# Patient Record
Sex: Female | Born: 1960 | Race: Black or African American | Hispanic: No | Marital: Single
Health system: Southern US, Academic
[De-identification: ages and names within clinical notes are randomized; demographics above are authoritative.]

## PROBLEM LIST (undated history)

## (undated) ENCOUNTER — Encounter

## (undated) ENCOUNTER — Ambulatory Visit: Payer: Medicare (Managed Care) | Attending: Infectious Disease | Primary: Infectious Disease

## (undated) ENCOUNTER — Ambulatory Visit: Payer: Medicare (Managed Care) | Attending: Pharmacist | Primary: Pharmacist

## (undated) ENCOUNTER — Ambulatory Visit: Payer: MEDICARE | Attending: "Endocrinology | Primary: "Endocrinology

## (undated) ENCOUNTER — Ambulatory Visit

## (undated) ENCOUNTER — Inpatient Hospital Stay: Payer: Medicare (Managed Care)

## (undated) ENCOUNTER — Telehealth: Attending: Infectious Disease | Primary: Infectious Disease

## (undated) ENCOUNTER — Ambulatory Visit: Payer: MEDICAID

## (undated) ENCOUNTER — Ambulatory Visit: Payer: MEDICARE

## (undated) ENCOUNTER — Encounter: Attending: Infectious Disease | Primary: Infectious Disease

## (undated) ENCOUNTER — Telehealth

## (undated) ENCOUNTER — Ambulatory Visit
Payer: MEDICARE | Attending: Rehabilitative and Restorative Service Providers" | Primary: Rehabilitative and Restorative Service Providers"

## (undated) ENCOUNTER — Ambulatory Visit: Payer: Medicare (Managed Care)

## (undated) ENCOUNTER — Ambulatory Visit: Attending: Pharmacist | Primary: Pharmacist

## (undated) ENCOUNTER — Encounter: Attending: "Endocrinology | Primary: "Endocrinology

## (undated) ENCOUNTER — Ambulatory Visit: Payer: MEDICARE | Attending: Infectious Disease | Primary: Infectious Disease

## (undated) ENCOUNTER — Encounter
Attending: Student in an Organized Health Care Education/Training Program | Primary: Student in an Organized Health Care Education/Training Program

## (undated) ENCOUNTER — Encounter
Attending: Pharmacist Clinician (PhC)/ Clinical Pharmacy Specialist | Primary: Pharmacist Clinician (PhC)/ Clinical Pharmacy Specialist

## (undated) ENCOUNTER — Telehealth: Attending: Clinical | Primary: Clinical

## (undated) ENCOUNTER — Other Ambulatory Visit

## (undated) ENCOUNTER — Telehealth: Attending: "Endocrinology | Primary: "Endocrinology

## (undated) ENCOUNTER — Ambulatory Visit: Payer: MEDICARE | Attending: Pharmacist | Primary: Pharmacist

## (undated) ENCOUNTER — Encounter: Attending: Medical | Primary: Medical

## (undated) DIAGNOSIS — I10 Essential (primary) hypertension: Secondary | ICD-10-CM

## (undated) DIAGNOSIS — Z21 Asymptomatic human immunodeficiency virus [HIV] infection status: Secondary | ICD-10-CM

## (undated) DIAGNOSIS — B20 Human immunodeficiency virus [HIV] disease: Secondary | ICD-10-CM

## (undated) DIAGNOSIS — E119 Type 2 diabetes mellitus without complications: Secondary | ICD-10-CM

## (undated) HISTORY — PX: TOTAL ABDOMINAL HYSTERECTOMY: SHX209

## (undated) HISTORY — PX: TRIGGER FINGER RELEASE: SHX641

---
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
## 2015-07-20 NOTE — Unmapped (Incomplete)
Diabetes Self Management Assessment    PCP: Conard Novak, MD, Phone: [phone], Fax: [phone]    Who is prescribing your diabetes medications?   Dr. Allena Katz    What was your last A1c result and date? 9.5    Epic available lab results  Lab Results   Component Value Date    A1C 9.5* 03/25/2015    A1C 8.9* 12/10/2014    A1C 11.6* 10/01/2014       What medications do you take for your diabetes? Metformin, glipizide twice a day    Epic has a current medication list which includes the following prescription(s): acyclovir, amitriptyline, blood sugar diagnostic, blood-glucose meter, clonidine hcl, elvitegravir-cobicistat-emtricitabine-tenofovir, glipizide, ibuprofen, lancets, metformin, methadone, omeprazole, and pravastatin.    In the last 7 days have you missed any doses of your diabetes medications? no    Do you have a blood glucose meter? yes  If so how often do you test your blood glucose daily? Four times   What are your most recent results from your blood glucose readings? 103     Do you follow any special diet? Stopped eating white bread, rice and sweets.  Trying to do sugar free cookies.    What do you drink for beverages each day & amount? Mostly drinking water.  Thirsty a lot.  Keep water bottle.    Do you participate in any exercise routine? Walk once in while.  Know that I need to exercise.  Have gained weight. Discussed importance of regular exercise and encouraged her to start out by walking 30 minutes a day.  She states she feels safe in her neighborhood and so suggested just walking 15 minutes in one direction and then turning around and coming back home to start and gradually increase up to an hour.    If so what type and how often?  n/a    Duration of Call: 15 minutes    Patient missed her appointment in Endocrinology in March.  She was aware she missed the appointment and stated that she has had two deaths in her family recently and has been traveling out of state.  I gave her the number to endocrinology and she is going to try to schedule the appointment on the same day that she has her ID appointment as she is picked up very early by the van to come to appointment.

## 2016-09-19 MED FILL — GENVOYA/150-150-200-1/TABS: GENVOYA/150-150-200-1/TABS | 30 days supply | Qty: 30 | Fill #1

## 2016-09-19 MED FILL — PRAVASTATIN SODIUM/20MG/TABS: PRAVASTATIN SODIUM/20MG/TABS | 30 days supply | Qty: 30 | Fill #4

## 2016-09-30 MED ORDER — METHADONE 10 MG TABLET
ORAL_TABLET | Freq: Four times a day (QID) | ORAL | 0 refills | 0 days | Status: CP
Start: 2016-09-30 — End: 2016-10-27

## 2016-10-04 NOTE — Unmapped (Signed)
Outreached to patient to schedule Annual Wellness Visit.  Patient was unable to be contacted. Left Message.

## 2016-10-17 NOTE — Unmapped (Unsigned)
[facility] Pharmacy Refill and Clinical Coordination Note: HIV     HIV Medication(s): Genvoya  Additional Medication(s): pravastatin, aspirin    Anna Cantrell, DOB: 11/22/1960  Phone: [phone] (home) , Alternate phone contact: N/A  Shipping address: [address]  [place] [address]  Phone or address changes today?: No  All above HIPAA information verified.  Insurance changes? No    Completed refill and clinical call assessment today to schedule patient's medication shipment from the [facility] [phone]).      MEDICATION RECONCILIATION    Confirmed the medication and dosage are correct and have not changed: Yes, regimen is correct and unchanged.    Were there any changes to your medication(s) in the past month:  No, there are no changes reported at this time.    HIV-related labs:  Lab Results   Component Value Date/Time    HIVRS Not Detected 03/30/2016 03:13 PM    HIVRS Not Detected 09/23/2015 02:47 PM    HIVRS Not Detected 03/25/2015 01:12 PM    HIVCM  03/30/2016 03:13 PM      Comment:      HIV-1 quantification by real-time RT-PCR is performed using the Abbott RealTime  HIV-1 test. This test is FDA approved and can quantitate HIV-1 RNA over the  range of 40 - 1,000,000 copies/mL (1.60 log(10) -6.00 log(10) copies/mL). The reference range for this assay is Not Detected.    HIVCM  09/23/2015 02:47 PM      Comment:      HIV-1 quantification by real-time RT-PCR is performed using the Abbott RealTime  HIV-1 test. This test is FDA approved and can quantitate HIV-1 RNA over the  range of 40 - 1,000,000 copies/mL (1.60 log(10) -6.00 log(10) copies/mL). The reference range for this assay is Not Detected.    HIVCM  03/25/2015 01:12 PM      Comment:      HIV-1 quantification by real-time RT-PCR is performed using the Abbott RealTime  HIV-1 test. This test is FDA approved and can quantitate HIV-1 RNA over the  range of 40 - 1,000,000 copies/mL (1.60 log(10) -6.00 log(10) copies/mL). The reference range for this assay is Not Detected.    ACD4 1,245 03/30/2016 03:13 PM    ACD4 1,873 03/25/2015 01:12 PM    ACD4 1,106 09/03/2014 10:26 AM         ADHERENCE      Number of tablets of HIV Medication(s) at home: 7    Did you miss any doses in the past 4 weeks? No missed doses reported.  Adherence counseling provided? Not needed     SIDE EFFECT MANAGEMENT    Are you tolerating your medication?:  Anna Cantrell reports tolerating the medication.  Side effect management discussed: None      Therapy is appropriate and should be continued.          FINANCIAL/SHIPPING    Delivery Scheduled: Yes, Expected medication delivery date: 10/19/16   Additional medications refilled: No additional medications/refills needed at this time.    Anna Cantrell did not have any additional questions at this time.    Delivery address validated in FSI scheduling system: Yes, address listed above is correct.      We will follow up with patient monthly for standard refill processing and delivery.      Thank you,  [name]   [facility] Shared [facility] Specialty Pharmacist

## 2016-10-18 MED FILL — PRAVASTATIN SODIUM/20MG/TABS: PRAVASTATIN SODIUM/20MG/TABS | 30 days supply | Qty: 30 | Fill #5

## 2016-10-18 MED FILL — GENVOYA/150-150-200-1/TABS: GENVOYA/150-150-200-1/TABS | 30 days supply | Qty: 30 | Fill #2

## 2016-10-22 MED ORDER — ASPIRIN 81 MG TABLET,DELAYED RELEASE
ORAL_TABLET | Freq: Every day | ORAL | 3 refills | 0.00000 days | Status: CP
Start: 2016-10-22 — End: 2016-10-22

## 2016-10-22 MED ORDER — ASPIRIN 81 MG TABLET,DELAYED RELEASE: 81 mg | tablet | Freq: Every day | 3 refills | 0 days | Status: AC

## 2016-10-27 MED ORDER — METHADONE 10 MG TABLET
ORAL_TABLET | Freq: Four times a day (QID) | ORAL | 0 refills | 0 days | Status: CP
Start: 2016-10-27 — End: 2016-10-28

## 2016-10-28 MED ORDER — METHADONE 10 MG TABLET
ORAL_TABLET | Freq: Four times a day (QID) | ORAL | 0 refills | 0.00000 days | Status: CP
Start: 2016-10-28 — End: 2016-11-23

## 2016-10-28 NOTE — Unmapped (Signed)
Patient AWV scheduled for 09/13  with Anna Leiter, LCSW

## 2016-11-09 NOTE — Unmapped (Deleted)
Name: Anna Cantrell  DOB: April 22, 1960  Today's Date: 11/09/2016  Age: 56 y.o.    MEDICARE SCREENING & PREVENTION GUIDELINES RECOMMENDATIONS DATE UP-TO-DATE OR   TO BE REASSESSED   AAA Ultrasound Once in men age 60 to 24 who have ever smoked or currently smoke. Abdominal ultrasound date: Not Found not applicable    Colorectal Cancer Screening Patients 50 to 75: stool cards annually OR colonoscopy every 10 years (or more frequently if high risk) OR FIT-DNA every 3 years.  Colonoscopy date: 05/17/2010  FOBT date: Not Found up to date    DEXA Bone Density Measurement Patients age 15-85 to have a DEXA every 5 years in postmenopausal women, males will defer to PCP. DXA date: Not Found not applicable    Diabetes Eye Exam  Annually if Diabetic, or every 2 years if prior exam did not show retinopathy in both eyes. Most Recent Eye Exam Date: Not Found due    Diabetes Foot Exam  Annually if Diabetic. Most Recent Foot Exam Date: Not Found due    Diabetes Screening (fasting glucose) Annually for patients age 32-70. If risk factors (family hx of DM, hx of gestational DM, and/or PCOS), or diagnosed with pre-diabetes, twice per year. Normal fasting glucose range: 65-99. If diagnosed with diabetes, not applicable. Lab Results   Component Value Date    GLU 301 (H) 03/30/2016     No results found for: GLUF  No results found for: FBSFP  Lab Results   Component Value Date    POCGLU 279 (H) 09/04/2015    not applicable    Heart Disease Screening (fasting lipid panel) Minimum of every 5 years, patients age 43-75,  if no apparent signs or symptoms of heart disease. If diagnosed with hyperlipidemia, not applicable. Lab Results   Component Value Date/Time    CHOL 256 03/18/2013    LDL 64.5 09/23/2015 02:47 PM    LDL 67 03/18/2013    not applicable    Mammogram Screening (women) Age 63-74 every 2 years.  Mammogram date: 02/05/2015 ask patient during AWV   Pelvic Exam & Pap Smear  (women) Women ages 72 to 66 every 3 years with negative cytology (pap smear)  OR,   women ages 77 to 52 every 5 years if they have had both a negative pap and human papillomavirus (HPV) OR,  Every 3 years if they had a positive HPV result. Pap Smear date: Not Found    HPV date: Not Found   ask patient during AWV   Hepatitis C Screening  A one-time screening for HCV infection for adults born between 38 & 1965. Lab Results   Component Value Date/Time    HEPCAB Nonreactive 09/03/2014 10:26 AM    up to date    Tdap/Td Tdap should be a one-time dose for adults and Td is given every 10 years.  See Below   up to date    Influenza Vaccine Annually  See Below   up to date    Prevnar 29 Prevnar given at age 11 and Pneumovax given one year later. These vaccines may be given in a different sequence depending on chronic conditions. (utilize BPA for dosing & administration) See Below   up to date    Pneumovax 23 Prevnar given at age 106 and Pneumovax given one year later. These vaccines may be given in a different sequence depending on chronic conditions. (utilize BPA for dosing & administration) See Below   up to date    Zostavax  Vaccine Once at age 36 or older (may not be covered by Medicare) See Below   not applicable    Shingrix Vaccine 2-dose series, beginning at age 60. The 2nd dose is administered 2-6 months after the first.  See Below   ask patient during AWV     Immunization History   Administered Date(s) Administered   ??? INFLUENZA TIV (TRI) PF (IM) 12/17/2012, 12/05/2013   ??? Influenza Vaccine Quad (IIV4 PF) 44mo+ injectable 12/10/2014, 04/04/2016   ??? Influenza Virus Vaccine, unspecified formulation 11/21/2011   ??? Pneumococcal Conjugate 13-Valent 12/10/2014   ??? Pneumococcal Polysaccharide 23 09/23/2015   ??? TdaP 12/05/2013

## 2016-11-14 NOTE — Unmapped (Signed)
[facility] Special[facility] Pharmacy Refill Coordination Note  Medication: GENVOYA    Unable to reach patient to schedule shipment for medication being filled at [facility]. Left voicemail on phone.  As this is the 3rd unsuccessful attempt to reach the patient, no additional phone call attempts will be made at this time.      Phone numbers attempted: [phone]  [phone]  Last scheduled delivery: SHIPPED 10/18/16    Please call the [facility] at [phone] (option 4) should you have any further questions.      Thanks,  [facility] Shared [facility] Specialty Team

## 2016-11-14 NOTE — Unmapped (Signed)
Pt called agitated stating that she cannot get her meds because of an outstanding co-pay of ~$22 with [facility] Shared services.     I explained to pt that she can ask them to set up a payment plan through [facility] shared services to help pay for the copays. Pt said she filled out an application in clinic which I suppose is the RW application. She thought would cover the expenses of her pharmacy which I told her was not the case.    Explained to her that it allows for pts to have services/access to social work who could assist her. I asked her if she wanted to be transferred to a SW and she said she did not want to speak with a SW at this time.    Sherilynn Phen

## 2016-11-26 MED ORDER — METHADONE 10 MG TABLET
ORAL_TABLET | Freq: Four times a day (QID) | ORAL | 0 refills | 0.00000 days | Status: CP
Start: 2016-11-26 — End: 2016-12-07

## 2016-12-05 MED FILL — GENVOYA/150-150-200-1/TABS: GENVOYA/150-150-200-1/TABS | 30 days supply | Qty: 30 | Fill #3

## 2016-12-05 MED FILL — AMITRIPTYLINE/100MG/TAB: AMITRIPTYLINE/100MG/TAB | 90 days supply | Qty: 90 | Fill #2

## 2016-12-05 MED FILL — PRAVASTATIN SODIUM/20MG/TABS: PRAVASTATIN SODIUM/20MG/TABS | 30 days supply | Qty: 30 | Fill #6

## 2016-12-05 NOTE — Unmapped (Signed)
[facility] Special[facility] Pharmacy Refill Coordination Note  Specialty Medication(s): Genvoya  Additional Medications shipped: amitriptyline, pravastatin    Anna Cantrell, DOB: [birth year]  Phone: [phone] (home) , Alternate phone contact: N/A  Phone or address changes today?: No  All above HIPAA information was verified with patient.  Shipping Address: [address]  [place] [address]   Insurance changes? No    Completed refill call assessment today to schedule patient's medication shipment from the [facility] [phone]).      Confirmed the medication and dosage are correct and have not changed: Yes, regimen is correct and unchanged.    Confirmed patient started or stopped the following medications in the past month:  No, there are no changes reported at this time.    Are you tolerating your medication?:  Anna Cantrell reports tolerating the medication.    ADHERENCE        Did you miss any doses in the past 4 weeks? No missed doses reported.    FINANCIAL/SHIPPING    Delivery Scheduled: Yes, Expected medication delivery date: 12/07/16     Anna Cantrell did not have any additional questions at this time.    Delivery address validated in FSI scheduling system: Yes, address listed in FSI is correct.    We will follow up with patient monthly for standard refill processing and delivery.      Thank you,  Rollen Sox   [facility] Shared [facility] Specialty Pharmacist

## 2016-12-07 ENCOUNTER — Ambulatory Visit
Admission: RE | Admit: 2016-12-07 | Discharge: 2016-12-07 | Disposition: A | Discharge status: Home / Self Care | Payer: MEDICARE | Attending: Infectious Disease | Admitting: Infectious Disease

## 2016-12-07 DIAGNOSIS — Z794 Long term (current) use of insulin: Secondary | ICD-10-CM

## 2016-12-07 DIAGNOSIS — G629 Polyneuropathy, unspecified: Secondary | ICD-10-CM

## 2016-12-07 DIAGNOSIS — E1122 Type 2 diabetes mellitus with diabetic chronic kidney disease: Secondary | ICD-10-CM

## 2016-12-07 DIAGNOSIS — N183 Chronic kidney disease, stage 3 (moderate): Secondary | ICD-10-CM

## 2016-12-07 DIAGNOSIS — Z23 Encounter for immunization: Principal | ICD-10-CM

## 2016-12-07 DIAGNOSIS — E1165 Type 2 diabetes mellitus with hyperglycemia: Secondary | ICD-10-CM

## 2016-12-07 DIAGNOSIS — K219 Gastro-esophageal reflux disease without esophagitis: Secondary | ICD-10-CM

## 2016-12-07 DIAGNOSIS — I10 Essential (primary) hypertension: Secondary | ICD-10-CM

## 2016-12-07 DIAGNOSIS — G894 Chronic pain syndrome: Secondary | ICD-10-CM

## 2016-12-07 DIAGNOSIS — B20 Human immunodeficiency virus [HIV] disease: Secondary | ICD-10-CM

## 2016-12-07 LAB — BASIC METABOLIC PANEL
ANION GAP: 12 mmol/L (ref 9–15)
BLOOD UREA NITROGEN: 11 mg/dL (ref 7–21)
BUN / CREAT RATIO: 12
CALCIUM: 9.5 mg/dL (ref 8.5–10.2)
CHLORIDE: 105 mmol/L (ref 98–107)
CO2: 21 mmol/L — ABNORMAL LOW (ref 22.0–30.0)
CREATININE: 0.9 mg/dL (ref 0.60–1.00)
EGFR MDRD NON AF AMER: >=60 mL/min/{1.73_m2} (ref >=60)
GLUCOSE RANDOM: 287 mg/dL — ABNORMAL HIGH (ref 65–179)
POTASSIUM: 4.1 mmol/L (ref 3.5–5.0)

## 2016-12-07 LAB — CBC W/ AUTO DIFF
EOSINOPHILS ABSOLUTE COUNT: 0.3 10*9/L (ref 0.0–0.4)
HEMATOCRIT: 38.7 % (ref 36.0–46.0)
LARGE UNSTAINED CELLS: 3 % (ref 0–4)
LYMPHOCYTES ABSOLUTE COUNT: 3.6 10*9/L (ref 1.5–5.0)
MEAN CORPUSCULAR HEMOGLOBIN CONC: 30.1 g/dL — ABNORMAL LOW (ref 31.0–37.0)
MEAN CORPUSCULAR HEMOGLOBIN: 28.1 pg (ref 26.0–34.0)
MEAN CORPUSCULAR VOLUME: 93.3 fL (ref 80.0–100.0)
MONOCYTES ABSOLUTE COUNT: 0.2 10*9/L (ref 0.2–0.8)
NEUTROPHILS ABSOLUTE COUNT: 5.2 10*9/L (ref 2.0–7.5)
PLATELET COUNT: 295 10*9/L (ref 150–440)
RED CELL DISTRIBUTION WIDTH: 13.7 % (ref 12.0–15.0)
WBC ADJUSTED: 9.6 10*9/L (ref 4.5–11.0)

## 2016-12-07 LAB — LDL CHOLESTEROL DIRECT: Cholesterol.in LDL:MCnc:Pt:Ser/Plas:Qn:Direct assay: 67.3

## 2016-12-07 LAB — CO2: Carbon dioxide:SCnc:Pt:Ser/Plas:Qn:: 21 — ABNORMAL LOW

## 2016-12-07 LAB — WBC ADJUSTED: Lab: 9.6

## 2016-12-07 LAB — SLIDE REVIEW

## 2016-12-07 LAB — TOXIC GRANULATION

## 2016-12-07 MED ORDER — CLONIDINE HCL 0.2 MG TABLET: tablet | 99 refills | 0 days

## 2016-12-07 MED ORDER — CLONIDINE HCL 0.2 MG TABLET
ORAL_TABLET | Freq: Two times a day (BID) | ORAL | PRN refills | 0.00000 days | Status: CP
Start: 2016-12-07 — End: 2017-06-07

## 2016-12-07 MED ORDER — ELVITEG 150 MG-COB 150 MG-EMTRICIT 200 MG-TENOFO ALAFENAM 10 MG TABLET
ORAL_TABLET | Freq: Every day | ORAL | 8 refills | 0.00000 days | Status: CP
Start: 2016-12-07 — End: 2016-12-07

## 2016-12-07 MED ORDER — ELVITEG 150 MG-COB 150 MG-EMTRICIT 200 MG-TENOFO ALAFENAM 10 MG TABLET: 1 | tablet | Freq: Every day | 8 refills | 0 days | Status: AC

## 2016-12-07 MED ORDER — OMEPRAZOLE 40 MG CAPSULE,DELAYED RELEASE: 40 mg | capsule | 99 refills | 0 days

## 2016-12-07 MED ORDER — METHADONE 10 MG TABLET
ORAL_TABLET | Freq: Four times a day (QID) | ORAL | 0 refills | 0 days | Status: CP
Start: 2016-12-07 — End: 2016-12-22

## 2016-12-07 MED ORDER — PRAVASTATIN 20 MG TABLET
ORAL_TABLET | Freq: Every day | ORAL | 8 refills | 0.00000 days | Status: CP
Start: 2016-12-07 — End: 2016-12-07

## 2016-12-07 MED ORDER — AMITRIPTYLINE 100 MG TABLET
ORAL_TABLET | Freq: Every evening | ORAL | PRN refills | 0.00000 days | Status: CP
Start: 2016-12-07 — End: 2017-06-07

## 2016-12-07 MED ORDER — ASPIRIN 81 MG TABLET,DELAYED RELEASE
ORAL_TABLET | Freq: Every day | ORAL | 3 refills | 0.00000 days | Status: CP
Start: 2016-12-07 — End: 2017-06-07

## 2016-12-07 MED ORDER — AMITRIPTYLINE 100 MG TABLET: 100 mg | tablet | Freq: Every evening | 99 refills | 0 days

## 2016-12-07 MED ORDER — INSULIN GLARGINE (U-100) 100 UNIT/ML (3 ML) SUBCUTANEOUS PEN
Freq: Every evening | SUBCUTANEOUS | 0 refills | 0 days | Status: CP
Start: 2016-12-07 — End: 2016-12-21

## 2016-12-07 MED ORDER — METFORMIN 1,000 MG TABLET: 1000 mg | tablet | Freq: Two times a day (BID) | 3 refills | 0 days | Status: AC

## 2016-12-07 MED ORDER — PRAVASTATIN 20 MG TABLET: 20 mg | tablet | Freq: Every day | 8 refills | 0 days | Status: AC

## 2016-12-07 MED ORDER — METFORMIN 1,000 MG TABLET
ORAL_TABLET | Freq: Two times a day (BID) | ORAL | 3 refills | 0.00000 days | Status: CP
Start: 2016-12-07 — End: 2017-06-07

## 2016-12-07 MED ORDER — OMEPRAZOLE 40 MG CAPSULE,DELAYED RELEASE
ORAL_CAPSULE | Freq: Every day | ORAL | 99 refills | 0.00000 days | Status: CP
Start: 2016-12-07 — End: 2016-12-07

## 2016-12-07 NOTE — Unmapped (Signed)
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 Her son was also found to be HIV+ and died at age 35 of AIDS, ca. September 05, 2003. Husband x 17 yrs, has always been HIV-negative practicing safe sex. Long term ART, few details, but never failed therapy, HIV RNA suppressed x years. Has been on qd Epizcom and Sustiva for years, despite some chronic HA she attributes to these meds.Was on suppressive Diflucan.    Doing generally well, feels she has better control of DM, reduced smoking.    Depression: going to counselor at [facility] in [place], going well  DM: Treated with oral agents. Metformin, Glipizide bid. Now at [facility] clinic, added qhs insulin 20U   Hypercholesterolemia: Pravastatin qd  HTN: On Clonidine 0.2 bid  GERD: Omeprazole 40 > prn  Chronic pain syndrome and Neuropathy: Dx and duration unclear, but likely 2ndary to diabetes. Has been getting Methadone 10 mg qid x several years and now stable/managable. Carries Dx also distinct peripheral neuropathy as well, cause unclear. Amtriptyline 100 qhs but decreasing use.  Bilateral hearing loss: ringing, cause, hx unclear  HSV II: Perirectal, prn ACV, none lately.  Smoking: few cigarettes every few days, says a pack lasts several weeks.  Hysterectomy 1995    ALLERGIES: vicodoin. tramadol    MEDS:  acyclovir (ZOVIRAX) 400 MG Three (3) times a day as needed. Not used recently  amitriptyline (ELAVIL) 100 MG nightly.   Genvoya qd  cloNIDine HCl (CATAPRES) 0.2 MG bid   glipiZIDE (GLUCOTROL) 10 MG bid  metFORMIN (GLUCOPHAGE) 1000 MG bid  methadone (DOLOPHINE) 10 MG tablet qid  Omeprazole (PRILOSEC) 40 MG qd prn, taking less and less  pravastatin (PRAVACHOL) 20 MG qd  Long acting Insulin on sliding scale 20U at night    PHYSICAL EXAMINATION:   AVSS as above   GENERAL: NAD, pleasant  HEENT: [place]/AT, PERRLA, EOMI, Orophar cl, dentition good, no oral lesions , fundi normal  Chest: Lungs clear throughout   Cor RRR S1S2   Breasts no masses  Abd soft nontender, no HSM NABS   Gen/Rect nl ext exam  Ext No CCE no lesion, nl jts   Neuro no abnormailities, CrNN 2-12 intact, Nl strength, sensation distally     A/P:  HIV:  Doing well, labs last time showed HIV RNA < 50 and CD4 1100 >> recheck HIV RNA & CD$    Switch to Genvoya seems to be doing well  DM:  Insulin as per Endo >> provider in [place]  Chronic pain: Will refill methadone monthly  HTN  No change, stable  Lipids:   LDL cholesterol 74 last  HSV  Stable  GERD  Stable using little PPI  HCM:  Mammogram OK Dec 2016, will repeat now    Refer for pap (none x yrs post hysterectomy) HER REQUEST, but will do here if not done by next visit    Colonoscopy in September 05, 2011    Wt loss, exercise >> lost 3 lbs    To see Optho    Try to get [facility] coverage for dental implants needed  RTC 6 months

## 2016-12-08 LAB — LYMPH MARKER LIMITED,FLOW
ABSOLUTE CD8 CNT: 1322 {cells}/uL (ref 180–1520)
CD3% (T CELLS)": 76 % (ref 61–86)
CD4% (T HELPER)": 41 % (ref 34–58)
CD4:CD8 RATIO: 1.2 (ref 0.9–4.8)

## 2016-12-08 LAB — ESTIMATED AVERAGE GLUCOSE: Estimated average glucose:MCnc:Pt:Bld:Qn:Estimated from glycated hemoglobin: 344

## 2016-12-08 LAB — CD3% (T CELLS)": Lab: 76

## 2016-12-08 LAB — SYPHILIS RPR SCREEN: Reagin Ab:PrThr:Pt:Ser:Ord:RPR: NONREACTIVE

## 2016-12-09 LAB — HIV RNA, QUANTITATIVE, PCR: HIV RNA QNT RSLT: NOT DETECTED

## 2016-12-09 LAB — HIV RNA: HIV 1 RNA:NCnc:Pt:Ser/Plas:Qn:Probe.amp.tar: 0

## 2016-12-21 MED ORDER — INSULIN GLARGINE (U-100) 100 UNIT/ML (3 ML) SUBCUTANEOUS PEN
Freq: Every evening | SUBCUTANEOUS | 0 refills | 0.00000 days | Status: CP
Start: 2016-12-21 — End: 2016-12-21

## 2016-12-21 MED ORDER — INSULIN GLARGINE (U-100) 100 UNIT/ML (3 ML) SUBCUTANEOUS PEN: mL | 0 refills | 0 days

## 2016-12-25 MED ORDER — METHADONE 10 MG TABLET
ORAL_TABLET | Freq: Four times a day (QID) | ORAL | 0 refills | 0 days | Status: CP
Start: 2016-12-25 — End: 2017-01-18

## 2016-12-28 MED FILL — OMEPRAZOLE/40MG/CPDR: OMEPRAZOLE/40MG/CPDR | 30 days supply | Qty: 30 | Fill #0

## 2016-12-28 MED FILL — LANTUS SOLOSTAR (BOX)/100UNIT/ML/SOLN: LANTUS SOLOSTAR (BOX)/100UNIT/ML/SOLN | 28 days supply | Qty: 1 | Fill #0

## 2016-12-28 MED FILL — PRAVASTATIN SODIUM/20MG/TABS: PRAVASTATIN SODIUM/20MG/TABS | 30 days supply | Qty: 30 | Fill #7

## 2016-12-28 MED FILL — METFORMIN HCL/1000MG/TABS: METFORMIN HCL/1000MG/TABS | 90 days supply | Qty: 180 | Fill #0

## 2017-01-04 MED FILL — GENVOYA/150-150-200-1/TABS: GENVOYA/150-150-200-1/TABS | 30 days supply | Qty: 30 | Fill #4

## 2017-01-04 NOTE — Unmapped (Signed)
Addended by: Adolphus Birchwood on: 01/04/2017 03:54 PM     Modules accepted: Orders

## 2017-01-04 NOTE — Unmapped (Addendum)
Addendum: [facility] was able to ship out med 10/31, so will be delivered 11/1. Anna Cantrell    [facility] Pharmacy Refill Coordination Note  Specialty Medication(s): GENVOYA  Others: Glipizide      Anna Cantrell, DOB: 05-17-1960  Phone: [phone] (home) , Alternate phone contact: N/A  Phone or address changes today?: No  All above HIPAA information was verified with patient.  Shipping Address: [address]  [place] [address]   Insurance changes? No    Completed refill call assessment today to schedule patient's medication shipment from the [facility] [phone]).      Confirmed the medication and dosage are correct and have not changed: Yes, regimen is correct and unchanged.    Confirmed patient started or stopped the following medications in the past month:  No, there are no changes reported at this time.    Are you tolerating your medication?:  Anna Cantrell reports tolerating the medication.    ADHERENCE        Did you miss any doses in the past 4 weeks? No missed doses reported.    FINANCIAL/SHIPPING    Delivery Scheduled: Yes, Expected medication delivery date: 01/06/17     Anna Cantrell did not have any additional questions at this time.    Delivery address validated in FSI scheduling system: Yes, address listed in FSI is correct.    We will follow up with patient monthly for standard refill processing and delivery.      Thank you,  [name]   [facility] Shared [facility] Specialty Technician

## 2017-01-05 MED ORDER — GLIPIZIDE 10 MG TABLET: 10 mg | tablet | Freq: Two times a day (BID) | 11 refills | 0 days | Status: AC

## 2017-01-05 MED ORDER — GLIPIZIDE 10 MG TABLET
ORAL_TABLET | Freq: Two times a day (BID) | ORAL | 11 refills | 0.00000 days | Status: CP
Start: 2017-01-05 — End: 2017-06-07

## 2017-01-06 MED FILL — GLIPIZIDE/10MG/TAB: GLIPIZIDE/10MG/TAB | 90 days supply | Qty: 180 | Fill #0

## 2017-01-20 MED ORDER — METHADONE 10 MG TABLET
ORAL_TABLET | Freq: Four times a day (QID) | ORAL | 0 refills | 0 days | Status: CP
Start: 2017-01-20 — End: 2017-02-15

## 2017-02-01 MED FILL — OMEPRAZOLE/40MG/CPDR: OMEPRAZOLE/40MG/CPDR | 30 days supply | Qty: 30 | Fill #1

## 2017-02-01 MED FILL — PRAVASTATIN SODIUM/20MG/TABS: PRAVASTATIN SODIUM/20MG/TABS | 30 days supply | Qty: 30 | Fill #8

## 2017-02-01 MED FILL — GENVOYA/150-150-200-1/TABS: GENVOYA/150-150-200-1/TABS | 30 days supply | Qty: 30 | Fill #5

## 2017-02-01 NOTE — Unmapped (Signed)
[facility] Special[facility] Pharmacy Refill Coordination Note  Specialty Medication(s): GENVOYA  OMEPRAZOLE  PRAVASTATIN      Anna Cantrell, DOB: March 18, 1960  Phone: [phone] (home) , Alternate phone contact: N/A  Phone or address changes today?: No  All above HIPAA information was verified with patient.  Shipping Address: [address]  [place] [address]   Insurance changes? No    Completed refill call assessment today to schedule patient's medication shipment from the [facility] [phone]).      Confirmed the medication and dosage are correct and have not changed: Yes, regimen is correct and unchanged.    Confirmed patient started or stopped the following medications in the past month:  No, there are no changes reported at this time.    Are you tolerating your medication?:  Anna Cantrell reports tolerating the medication.    ADHERENCE      Did you miss any doses in the past 4 weeks? No missed doses reported.    FINANCIAL/SHIPPING    Delivery Scheduled: Yes, Expected medication delivery date: 02/03/17     Anna Cantrell did not have any additional questions at this time.    Delivery address validated in FSI scheduling system: Yes, address listed in FSI is correct.    We will follow up with patient monthly for standard refill processing and delivery.      Thank you,  [name]   [facility] Shared [facility] Specialty Technician

## 2017-02-15 MED ORDER — METHADONE 10 MG TABLET
ORAL_TABLET | Freq: Four times a day (QID) | ORAL | 0 refills | 0 days | Status: CP
Start: 2017-02-15 — End: 2017-03-13

## 2017-02-23 NOTE — Unmapped (Signed)
Specialty Pharmacy Refill Coordination Note     Sharene L. Langenfeld is a 56 y.o. female contacted today regarding refills of her specialty medication(s).    Reviewed and verified with patient:      Specialty medication(s) and dose(s) confirmed: no  Changes to medications: no  Changes to insurance: no    Medication Adherence    Patient reported X missed doses in the last month:  0  Specialty Medication:  GENVOYA QOH 10- DAYS   Patient is on additional specialty medications:  No  Informant:  patient  Confirmed plan for next specialty medication refill:  delivery by pharmacy  Refills needed for supportive medications:  yes, ordered or provider notified         Refill Coordination    Has the Patients' Contact Information Changed:  No  Is the Shipping Address Different:  No       Shipping Information    Delivery Scheduled:  Yes  Delivery Date:  03/02/17  Medications to be Shipped:  GENVOYA PRAVASTATIN OMEPRAZOLE LANTUS           Follow-up: 28 day(s)     Ledora Bottcher

## 2017-02-24 MED ORDER — LANTUS SOLOSTAR U-100 INSULIN 100 UNIT/ML (3 ML) SUBCUTANEOUS PEN
PEN_INJECTOR | PRN refills | 0 days | Status: CP
Start: 2017-02-24 — End: 2017-06-07

## 2017-02-24 MED ORDER — INSULIN GLARGINE (U-100) 100 UNIT/ML (3 ML) SUBCUTANEOUS PEN
99 refills | 0 days
Start: 2017-02-24 — End: 2017-02-24

## 2017-02-26 MED FILL — OMEPRAZOLE/40MG/CPDR: OMEPRAZOLE/40MG/CPDR | 30 days supply | Qty: 30 | Fill #2

## 2017-02-26 MED FILL — PRAVASTATIN SODIUM/20MG/TABS: PRAVASTATIN SODIUM/20MG/TABS | 90 days supply | Qty: 90 | Fill #0

## 2017-02-26 MED FILL — LANTUS SOLOSTAR (BOX)/100UNIT/ML/SOLN: LANTUS SOLOSTAR (BOX)/100UNIT/ML/SOLN | 90 days supply | Qty: 6 | Fill #0

## 2017-02-26 MED FILL — GENVOYA/150-150-200-1/TABS: GENVOYA/150-150-200-1/TABS | 30 days supply | Qty: 30 | Fill #6

## 2017-03-13 MED ORDER — METHADONE 10 MG TABLET
ORAL_TABLET | Freq: Four times a day (QID) | ORAL | 0 refills | 0 days | Status: CP
Start: 2017-03-13 — End: 2017-04-14

## 2017-03-22 NOTE — Unmapped (Signed)
[facility] Specialty Pharmacy Refill and Clinical Coordination Note: HIV     HIV Medication(s): Genvoya  Additional Medication(s): amitriptyline, pen tips    Anna Cantrell, DOB: Sep 28, 1960  Phone: [phone] (home) , Alternate phone contact: N/A  Shipping address: [address]  [place] [address]  Phone or address changes today?: No  All above HIPAA information verified.  Insurance changes? No    Completed refill and clinical call assessment today to schedule patient's medication shipment from the [facility] [phone]).      MEDICATION RECONCILIATION    Confirmed the medication and dosage are correct and have not changed: Yes, regimen is correct and unchanged.    Were there any changes to your medication(s) in the past month:  No, there are no changes reported at this time.    HIV-related labs:  Lab Results   Component Value Date/Time    HIVRS Not Detected 12/07/2016 03:12 PM    HIVRS Not Detected 03/30/2016 03:13 PM    HIVRS Not Detected 09/23/2015 02:47 PM    HIVCM  12/07/2016 03:12 PM      Comment:      HIV-1 quantification by real-time RT-PCR is performed using the Abbott RealTime  HIV-1 test. This test is FDA approved and can quantitate HIV-1 RNA over the  range of 40 - 1,000,000 copies/mL (1.60 log(10) -6.00 log(10) copies/mL). The reference range for this assay is Not Detected.    HIVCM  03/30/2016 03:13 PM      Comment:      HIV-1 quantification by real-time RT-PCR is performed using the Abbott RealTime  HIV-1 test. This test is FDA approved and can quantitate HIV-1 RNA over the  range of 40 - 1,000,000 copies/mL (1.60 log(10) -6.00 log(10) copies/mL). The reference range for this assay is Not Detected.    HIVCM  09/23/2015 02:47 PM      Comment:      HIV-1 quantification by real-time RT-PCR is performed using the Abbott RealTime  HIV-1 test. This test is FDA approved and can quantitate HIV-1 RNA over the  range of 40 - 1,000,000 copies/mL (1.60 log(10) -6.00 log(10) copies/mL). The reference range for this assay is Not Detected.    ACD4 1,594 12/07/2016 03:12 PM    ACD4 1,245 03/30/2016 03:13 PM    ACD4 1,873 03/25/2015 01:12 PM         ADHERENCE      Number of tablets of HIV Medication(s) at home: 7    Did you miss any doses in the past 4 weeks? No missed doses reported.  Adherence counseling provided? Not needed     SIDE EFFECT MANAGEMENT    Are you tolerating your medication?:  Anna Cantrell reports tolerating the medication.  Side effect management discussed: None      Therapy is appropriate and should be continued.    Evidence of clinical benefit: See Epic note from 12/07/16      FINANCIAL/SHIPPING    Delivery Scheduled: Yes, Expected medication delivery date: 03/23/17   Additional medications refilled: No additional medications/refills needed at this time.    Anna Cantrell did not have any additional questions at this time.    Delivery address validated in FSI scheduling system: Yes, address listed above is correct.      We will follow up with patient monthly for standard refill processing and delivery.      Thank you,  [name]   [facility] Shared [facility] Specialty Pharmacist

## 2017-03-25 NOTE — Unmapped (Signed)
[facility] Refill Coordination Note  Specialty Medication(s): Genvoya 150-150-200-1mg   Additional Medications shipped:  Amitriptyline 100mg  & Unifine 32g x 4mm Needles    Anna Cantrell, DOB: [birth year]  Phone: [phone] (home) , Alternate phone contact: N/A  Phone or address changes today?: No  All above HIPAA information was verified with patient.  Shipping Address: [address]  [place] [address]   Insurance changes? No    Completed refill call assessment today to schedule patient's medication shipment from the [facility] [phone]).      Confirmed the medication and dosage are correct and have not changed: Yes, regimen is correct and unchanged.    Confirmed patient started or stopped the following medications in the past month:  No, there are no changes reported at this time.    Are you tolerating your medication?:  Anna Cantrell reports tolerating the medication.    ADHERENCE    (Below is required for Medicare Part B or Transplant patients only - per drug):   How many tablets were dispensed last month: 30    Patient currently has 11 tablets remaining.    Did you miss any doses in the past 4 weeks? No missed doses reported.    FINANCIAL/SHIPPING    Delivery Scheduled: Yes, Expected medication delivery date: 03/29/2017     Anna Cantrell did not have any additional questions at this time.    Delivery address validated in FSI scheduling system: Yes, address listed in FSI is correct.    We will follow up with patient monthly for standard refill processing and delivery.      Thank you,  Tamala Fothergill   [facility] Shared [facility] Specialty Technician

## 2017-03-28 ENCOUNTER — Emergency Department: Admit: 2017-03-28 | Discharge: 2017-03-28 | Disposition: A | Discharge status: Home / Self Care | Payer: MEDICARE

## 2017-03-28 ENCOUNTER — Ambulatory Visit: Admit: 2017-03-28 | Discharge: 2017-03-28 | Disposition: A | Discharge status: Home / Self Care | Payer: MEDICARE

## 2017-03-28 ENCOUNTER — Ambulatory Visit: Admit: 2017-03-28 | Payer: MEDICARE

## 2017-03-28 DIAGNOSIS — M79602 Pain in left arm: Principal | ICD-10-CM

## 2017-03-28 MED ORDER — DICLOFENAC 1 % TOPICAL GEL
Freq: Four times a day (QID) | TOPICAL | 0 refills | 0.00000 days | Status: CP
Start: 2017-03-28 — End: 2018-03-28

## 2017-03-28 MED FILL — GENVOYA/150-150-200-1/TABS: GENVOYA/150-150-200-1/TABS | 30 days supply | Qty: 30 | Fill #7

## 2017-03-28 MED FILL — AMITRIPTYLINE/100MG/TAB: AMITRIPTYLINE/100MG/TAB | 90 days supply | Qty: 90 | Fill #3

## 2017-03-28 NOTE — Unmapped (Signed)
[facility] Emergency Department Provider Note      ED Clinical Impression     Final diagnoses:   Strain of AC joint, left, initial encounter (Primary)       Initial Impression, ED Course, Assessment and Plan     Patient is a 57 y.o. female with a PMH of HIV, diabetes, GERD, and depression presenting to the ED for left arm pain.     On exam, the patient appears well and is in NAD. VS are WNL. Appears non toxic in no acte distress. Lungs clear bilaterally. Generalized tenderness to the anterior left shoulder. There is negative edema, erythema, ecchymosis, or rash noted to the area. Distal pulses intact. Decreased Abduction and internal rotation to left shoulder due to pain, however patient able to abduct ~70degrees. Strength and sensation in tact.     Differential includes shoulder strain vs OA vs occult fracture  Low suspicion for DVT vs infection given no erythema or edema on exam, patient able to range ~70 degrees. Patient denies any fevers, nausea, vomiting, swelling, erythema, and has no history of clots. Patient denies IV drug use, recent surgeries, trauma, or recent travel.     Plan for xray left shoulder . Will give lidoderm patch and toradol.    Upon reassessment, patient sleeping prior to me entering the room and in NAD. She states her pain level is the same but appears in NAD. I discussed results with her and need for tylenol as needed for pain along with RICE. Will Rx topical diclofenac and refer to orthopedics for follow up as needed. I discussed extensively strict return precautions. Patient expressed understanding and agrees to plan.      Labs     Labs Reviewed - No data to display    Radiology   XR Shoulder 3 Or More Views Left (Final result)   Result time 03/28/17 13:54:45   Final result by Lindie Spruce, MD (03/28/17 13:54:45)     Impression:     No acute fracture. Mild widening of the [facility] joint may represent acromioclavicular ligament sprain.    History     Chief Complaint Arm Pain      HPI   Patient was seen by me at 1:04 PM.    Patient is a right hand dominant 57 y.o. female with a PMH of HIV, diabetes, GERD, and depression presenting to the ED for left arm pain. Patient reports onset of her arm pain began 1.5 weeks ago. She describes the pain as cramping and throbbing in her shoulder that radiates into her elbow. Her pain is worsened with movement and better with rest. She denies known trauma or injury. She has been taking ibuprofen without alleviation of her symptoms. She denies chest pain or shortness of breath. She denies fever, abdominal pain, nausea, vomiting, diarrhea, headache, dizziness, numbness, tingling, or other physical complaints at this time. She denies IVDU, recent surgeries, recent travel, recent sick contacts, or other physical complaints at this time.     Previous chart, nursing notes, and vital signs reviewed.      Pertinent labs & imaging results that were available during my care of the patient were reviewed by me and considered in my medical decision making (see chart for details).    Past Medical History:   Diagnosis Date   ??? Chronic pain    ??? Depression    ??? GERD (gastroesophageal reflux disease)    ??? HIV (human immunodeficiency virus infection) (CMS-HCC)    ???  Hyperlipidemia        Past Surgical History:   Procedure Laterality Date   ??? HYSTERECTOMY     ??? OOPHORECTOMY         No current facility-administered medications for this encounter.     Current Outpatient Prescriptions:   ???  acyclovir (ZOVIRAX) 400 MG tablet, TAKE 1 TABLET BY MOUTH THREE TIMES DAILY AS NEEDED, Disp: 30 tablet, Rfl: 10  ???  amitriptyline (ELAVIL) 100 MG tablet, Take 1 tablet (100 mg total) by mouth nightly as needed for sleep., Disp: 90 tablet, Rfl: PRN  ???  aspirin (ECOTRIN) 81 MG tablet, Take 1 tablet (81 mg total) by mouth daily., Disp: 30 tablet, Rfl: 3  ???  blood sugar diagnostic Strp, Check every morning before breakfast., Disp: 300 each, Rfl: 0  ???  blood-glucose meter (PRODIGY AUTOCODE METER) kit, Use as instructed, Disp: 1 each, Rfl: 0  ???  cloNIDine HCl (CATAPRES) 0.2 MG tablet, Take 1 tablet (0.2 mg total) by mouth Two (2) times a day., Disp: 60 tablet, Rfl: PRN  ???  diclofenac sodium (VOLTAREN) 1 % gel, Apply 2 g topically Four (4) times a day., Disp: 100 g, Rfl: 0  ???  elvitegravir-cobicistat-emtricitabine-tenofovir (GENVOYA) 150-150-200-10 mg Tab tablet, Take 1 tablet by mouth daily., Disp: 30 tablet, Rfl: 8  ???  glipiZIDE (GLUCOTROL) 10 MG tablet, Take 1 tablet (10 mg total) by mouth Two (2) times a day (30 minutes before a meal)., Disp: 180 tablet, Rfl: 11  ???  lancets (PRODIGY LANCETS) 28 gauge Misc, Use to test blood sugar 3 times daily, Disp: 33 each, Rfl: 3  ???  LANTUS SOLOSTAR U-100 INSULIN 100 unit/mL (3 mL) injection pen, INJECT 27 UNITS UNDER THE SKIN NIGHTLY . MAY INCREASE UP TO 50 UNITS PER DAY AS DIRECTED BY ENDOCRINE CLINIC, Disp: 30 pen, Rfl: PRN  ???  metFORMIN (GLUCOPHAGE) 1000 MG tablet, Take 1 tablet (1,000 mg total) by mouth 2 (two) times a day with meals., Disp: 180 tablet, Rfl: 3  ???  methadone (DOLOPHINE) 10 MG tablet, Take 1 tablet (10 mg total) by mouth QID., Disp: 120 tablet, Rfl: 0  ???  omeprazole (PRILOSEC) 40 MG capsule, Take 1 capsule (40 mg total) by mouth daily., Disp: 30 capsule, Rfl: PRN  ???  pen needle, diabetic (BD ULTRA-FINE NANO PEN NEEDLES) 32 gauge x 5/32 Ndle, Use to inject Lantus nightly., Disp: 100 each, Rfl: 12  ???  pravastatin (PRAVACHOL) 20 MG tablet, Take 1 tablet (20 mg total) by mouth daily., Disp: 90 tablet, Rfl: 8    Allergies  Hydrocodone-acetaminophen and Tramadol    Family History   Problem Relation Age of Onset   ??? Adopted: Yes   ??? Family history unknown: Yes       Social History  Social History   Substance Use Topics   ??? Smoking status: Current Some Day Smoker   ??? Smokeless tobacco: Never Used      Comment: pack will last 2 weeks   ??? Alcohol use No       Review of Systems    Constitutional:  Negative for fever or chills. Negative for wt. loss.  Eyes:  Negative for visual changes, erythema, drainage.  ENT:  Negative for ear pain, loss of hearing. Negative for rhinorrhea or epistaxis. Negative for sore throat, trismus, or hoarse voice.  Cardiovascular:  Negative for chest pain, palpitations, or pedal edema.  Respiratory:  Negative for shortness of breath, cough, wheezing, or hemoptysis.  Gastrointestinal:  Negative for  abdominal pain, nausea, vomiting, constipation, diarrhea, or melena.  Genitourinary:  Negative for dysuria, urgency, frequency, hematuria. Negative for vaginal discharge or bleeding.  Musculoskeletal: Positive for left arm pain. Negative for back pain. Negative for neck pain. Negative for joint pain or swelling.  Skin:  Negative for rash. Negative for pallor. Negative for diaphoresis.  Neurological:  Negative for headaches, weakness, AMS, LOC, dizziness, or numbness.  Psych: Negative for SI, HI, A/V Hallucinations. Negative for agitation.       Physical Exam     VITAL SIGNS:    Vitals:    03/28/17 1238   BP: 165/93   Resp: 16   Temp: 36.8 ??C (98.3 ??F)   TempSrc: Oral   SpO2: 100%         Constitutional: Alert and oriented. Well appearing and in no distress.  Eyes: Conjunctivae are normal. PERRLA. EOMs intact.   ENT  Head: Normocephalic and atraumatic.   Ear: EACs without exudate or erythema. TMs without erythema or effusion  Nose: No congestion. No epistaxis  Mouth/Throat: Mucous membranes are moist without lesions/ulcerations. Posterior Oropharynx patent. Tonsils without erythema or exudate. Uvula is midline. Soft palate is soft without induration. Dentition intact without cracks/decay.  Neck: No stridor. Thyroid without nodules. Full ROM without pain. No spinous process tenderness. No nuchal rigidity.  Hematological/Lymphatic/Immunilogical: No cervical lymphadenopathy.  Cardiovascular: Normal rate, regular rhythm. Normal and symmetric distal pulses are present in all extremities. No gallops, murmurs, or clicks.  Respiratory: Normal respiratory effort. Breath sounds are normal.  Gastrointestinal: Soft and nontender.   Musculoskeletal: Generalized tenderness to the anterior left shoulder. There is negative edema, edema, ecchymosis, or rash noted to the area. Distal pulses in tact. Decreased Abduction and internal rotation to left shoulder due to pain. Strength and sensation in tact.   Neurologic: Normal speech and language.  Skin: Skin is warm, dry and intact. No rash noted. No pallor.  Psychiatric: Mood and affect are normal. Speech and behavior are normal.     Documentation assistance was provided by Viviano Simas, Scribe, on March 28, 2017 at 1:08 PM for Claudean Kinds, PA-C.    March 28, 2017 at 1:08 PM Documentation assistance was provided by the scribe in my presence.  The documentation recorded by the scribe has been reviewed and validated by me and accurately reflects the services I personally performed.             Odis Luster, PA  03/28/17 [phone]

## 2017-03-28 NOTE — Unmapped (Signed)
Patient transported to X-ray  Transported by Radiology  How tranported Ambulated   Cardiac Monitor no

## 2017-03-28 NOTE — Unmapped (Signed)
Pt presents with left arm pain x 5 days. Pt states the pain is from her shoulder to her elbow and is worse with movement. Pt states she has taken motrin without relief. Pt denies injury to the extremity.

## 2017-03-29 MED ORDER — UNIFINE PENTIPS 32 GAUGE X 5/32" NEEDLE
PRN refills | 0 days | Status: CP
Start: 2017-03-29 — End: ?

## 2017-03-29 MED ORDER — PEN NEEDLE, DIABETIC 32 GAUGE X 1/6"
99 refills | 0 days | Status: CP
Start: 2017-03-29 — End: 2018-06-12

## 2017-04-02 MED FILL — UNIFINE PENTIPS 32GX4MM/32GX4MM/MISC: UNIFINE PENTIPS 32GX4MM/32GX4MM/MISC | 90 days supply | Qty: 100 | Fill #0

## 2017-04-05 NOTE — Unmapped (Signed)
This encounter was created in error - please disregard.

## 2017-04-14 MED ORDER — METHADONE 10 MG TABLET
ORAL_TABLET | Freq: Four times a day (QID) | ORAL | 0 refills | 0.00000 days | Status: CP
Start: 2017-04-14 — End: 2017-05-05

## 2017-04-14 NOTE — Unmapped (Signed)
Patient called stating she has had some sort of upper respiratory thing going on for the past week that is causing her to cough a lot, keeping her up at night and causing some urinary incontinence.  She asks if you would be willing to give her an antibiotic.  She denies any fevers and says she has been trying mucinex without any help.  I discussed that without being seen and without any fevers that all I could do was let you know of her request.  I also informed her that she could come to be seen in clinic on Monday morning.

## 2017-04-18 ENCOUNTER — Ambulatory Visit
Admission: EM | Admit: 2017-04-18 | Discharge: 2017-04-18 | Disposition: A | Discharge status: Home / Self Care | Payer: Medicare HMO | Attending: Family Medicine | Admitting: Family Medicine

## 2017-04-18 ENCOUNTER — Other Ambulatory Visit: Payer: Self-pay

## 2017-04-18 DIAGNOSIS — J209 Acute bronchitis, unspecified: Secondary | ICD-10-CM

## 2017-04-18 DIAGNOSIS — R05 Cough: Secondary | ICD-10-CM | POA: Diagnosis not present

## 2017-04-18 DIAGNOSIS — R059 Cough, unspecified: Secondary | ICD-10-CM

## 2017-04-18 HISTORY — DX: Human immunodeficiency virus (HIV) disease: B20

## 2017-04-18 HISTORY — DX: Asymptomatic human immunodeficiency virus (hiv) infection status: Z21

## 2017-04-18 HISTORY — DX: Essential (primary) hypertension: I10

## 2017-04-18 MED ORDER — ALBUTEROL SULFATE HFA 108 (90 BASE) MCG/ACT IN AERS: 2.0000 | INHALATION_SPRAY | Freq: Four times a day (QID) | RESPIRATORY_TRACT | 0 refills | Status: AC | PRN

## 2017-04-18 MED ORDER — BENZONATATE 200 MG PO CAPS: 200.0000 mg | ORAL_CAPSULE | Freq: Three times a day (TID) | ORAL | 0 refills | Status: DC | PRN

## 2017-04-18 MED ORDER — AZITHROMYCIN 250 MG PO TABS: ORAL_TABLET | ORAL | 0 refills | Status: DC

## 2017-04-18 NOTE — ED Triage Notes (Signed)
Pt with cough and congestion x past week. "I feel like a can't get good air in"

## 2017-04-18 NOTE — ED Provider Notes (Signed)
[facility]    CSN: [id number] Arrival date & time: 04/18/17  1237     History   Chief Complaint Chief Complaint  Patient presents with  . Cough    HPI Steward DroneVeronica Cerda is a 57 y.o. female.   The history is provided by the patient.  Cough  Associated symptoms: wheezing   URI  Presenting symptoms: congestion, cough and fatigue   Severity:  Moderate Onset quality:  Sudden Duration:  1 week Timing:  Constant Progression:  Worsening Chronicity:  New Relieved by:  Nothing Ineffective treatments:  OTC medications Associated symptoms: sinus pain and wheezing   Risk factors: diabetes mellitus and immunosuppression   Risk factors: not elderly, no chronic cardiac disease, no chronic kidney disease, no chronic respiratory disease, no recent illness, no recent travel and no sick contacts     Past Medical History:  Diagnosis Date  . HIV (human immunodeficiency virus infection) (HCC)   . Hypertension     There are no active problems to display for this patient.   Past Surgical History:  Procedure Laterality Date  . TOTAL ABDOMINAL HYSTERECTOMY      OB History    No data available       Home Medications    Prior to Admission medications   Medication Sig Start Date End Date Taking? Authorizing Provider  cloNIDine (CATAPRES) 0.1 MG tablet Take 0.1 mg by mouth 2 (two) times daily.   Yes [provider]  glipiZIDE (GLUCOTROL) 10 MG tablet Take 10 mg by mouth daily before breakfast.   Yes [provider]  metFORMIN (GLUCOPHAGE) 1000 MG tablet Take 1,000 mg by mouth 2 (two) times daily with a meal.   Yes [provider]  albuterol (PROVENTIL HFA;VENTOLIN HFA) 108 (90 Base) MCG/ACT inhaler Inhale 2 puffs into the lungs every 6 (six) hours as needed for wheezing or shortness of breath. 04/18/17   Payton Mccallumonty, Annaliesa Blann, MD  azithromycin (ZITHROMAX Z-PAK) 250 MG tablet 2 tabs po once day 1, then 1 tab po qd for next 4 days 04/18/17   Payton Mccallumonty,  Shynia Daleo, MD  benzonatate (TESSALON) 200 MG capsule Take 1 capsule (200 mg total) by mouth 3 (three) times daily as needed. 04/18/17   Payton Mccallumonty, Aevah Stansbery, MD    Family History Family History  Adopted: Yes    Social History Social History   Tobacco Use  . Smoking status: Current Some Day Smoker    Types: Cigarettes  . Smokeless tobacco: Never Used  Substance Use Topics  . Alcohol use: No    Frequency: Never  . Drug use: No     Allergies   Tramadol and Vicodin [hydrocodone-acetaminophen]   Review of Systems Review of Systems  Constitutional: Positive for fatigue.  HENT: Positive for congestion and sinus pain.   Respiratory: Positive for cough and wheezing.      Physical Exam Triage Vital Signs ED Triage Vitals  Enc Vitals Group     BP 04/18/17 1256 (!) 131/94     Pulse Rate 04/18/17 1256 100     Resp 04/18/17 1256 (!) 24     Temp 04/18/17 1256 98.6 F (37 C)     Temp Source 04/18/17 1256 Oral     SpO2 04/18/17 1256 99 %     Weight 04/18/17 1256 195 lb (88.5 kg)     Height 04/18/17 1256 5' 1.5" (1.562 m)     Head Circumference --      Peak Flow --      Pain  Score 04/18/17 1347 7     Pain Loc --      Pain Edu? --      Excl. in GC? --    No data found.  Updated Vital Signs BP (!) 131/94   Pulse 100   Temp 98.6 F (37 C) (Oral)   Resp (!) 24   Ht 5' 1.5" (1.562 m)   Wt 195 lb (88.5 kg)   SpO2 99%   BMI 36.25 kg/m   Visual Acuity Right Eye Distance:   Left Eye Distance:   Bilateral Distance:    Right Eye Near:   Left Eye Near:    Bilateral Near:     Physical Exam  Constitutional: She appears well-developed and well-nourished. No distress.  HENT:  Head: Normocephalic and atraumatic.  Right Ear: Tympanic membrane, external ear and ear canal normal.  Left Ear: Tympanic membrane, external ear and ear canal normal.  Nose: Mucosal edema and rhinorrhea present. No nose lacerations, sinus tenderness, nasal deformity, septal deviation or nasal septal  hematoma. No epistaxis.  No foreign bodies. Right sinus exhibits maxillary sinus tenderness and frontal sinus tenderness. Left sinus exhibits maxillary sinus tenderness and frontal sinus tenderness.  Mouth/Throat: Uvula is midline, oropharynx is clear and moist and mucous membranes are normal. No oropharyngeal exudate.  Eyes: Conjunctivae and EOM are normal. Pupils are equal, round, and reactive to light. Right eye exhibits no discharge. Left eye exhibits no discharge. No scleral icterus.  Neck: Normal range of motion. Neck supple. No thyromegaly present.  Cardiovascular: Normal rate, regular rhythm and normal heart sounds.  Pulmonary/Chest: Effort normal. No stridor. No respiratory distress. She has wheezes (expiratory, diffuse plus diffuse rhonchi). She has no rales.  Lymphadenopathy:    She has no cervical adenopathy.  Skin: She is not diaphoretic.  Nursing note and vitals reviewed.    UC Treatments / Results  Labs (all labs ordered are listed, but only abnormal results are displayed) Labs Reviewed - No data to display  EKG  EKG Interpretation None       Radiology No results found.  Procedures Procedures (including critical care time)  Medications Ordered in UC Medications - No data to display   Initial Impression / Assessment and Plan / UC Course  I have reviewed the triage vital signs and the nursing notes.  Pertinent labs & imaging results that were available during my care of the patient were reviewed by me and considered in my medical decision making (see chart for details).       Final Clinical Impressions(s) / UC Diagnoses   Final diagnoses:  Cough  Acute bronchitis, unspecified organism    ED Discharge Orders        Ordered    azithromycin (ZITHROMAX Z-PAK) 250 MG tablet     04/18/17 1345    albuterol (PROVENTIL HFA;VENTOLIN HFA) 108 (90 Base) MCG/ACT inhaler  Every 6 hours PRN     04/18/17 1345    benzonatate (TESSALON) 200 MG capsule  3 times  daily PRN     04/18/17 1345     1. diagnosis reviewed with patient 2. rx as per orders above; reviewed possible side effects, interactions, risks and benefits  3. Recommend supportive treatment with rest, fluids 4. Follow-up prn if symptoms worsen or don't improve  Controlled Substance Prescriptions [place] Controlled Substance Registry consulted? Not Applicable   Payton Mccallum, MD 04/18/17 1410

## 2017-04-19 ENCOUNTER — Telehealth: Payer: Self-pay

## 2017-04-19 NOTE — Telephone Encounter (Signed)
Pt called about Medicaid not covering Tessalon, she was informed that she can purchase an OTC cough medicine since insurance doesn't cover it. She was ok with this plan.

## 2017-04-20 MED FILL — METFORMIN HCL/1000MG/TABS: METFORMIN HCL/1000MG/TABS | 90 days supply | Qty: 180 | Fill #1

## 2017-04-24 NOTE — Unmapped (Signed)
[facility] Refill Coordination Note  Specialty Medication(s): GENVOYA      Anna Cantrell, DOB: 06-25-1960  Phone: [phone] (home) , Alternate phone contact: N/A  Phone or address changes today?: No  All above HIPAA information was verified with patient.  Shipping Address: [address]  [place] [address]   Insurance changes? No    Completed refill call assessment today to schedule patient's medication shipment from the [facility] [phone]).      Confirmed the medication and dosage are correct and have not changed: Yes, regimen is correct and unchanged.    Confirmed patient started or stopped the following medications in the past month:  No, there are no changes reported at this time.    Are you tolerating your medication?:  Anna Cantrell reports tolerating the medication.    ADHERENCE    (Below is required for Medicare Part B or Transplant patients only - per drug):   How many tablets were dispensed last month: 30  Patient currently has 13 remaining.    Did you miss any doses in the past 4 weeks? No missed doses reported.    FINANCIAL/SHIPPING    Delivery Scheduled: Yes, Expected medication delivery date: 05/03/17     Anna Cantrell did not have any additional questions at this time.    Delivery address validated in FSI scheduling system: Yes, address listed in FSI is correct.    We will follow up with patient monthly for standard refill processing and delivery.      Thank you,  [name]   [facility] Shared [facility] Specialty Technician

## 2017-05-01 MED FILL — GENVOYA/150-150-200-1/TABS: GENVOYA/150-150-200-1/TABS | 30 days supply | Qty: 30 | Fill #8

## 2017-05-06 MED ORDER — METHADONE 10 MG TABLET
ORAL_TABLET | Freq: Four times a day (QID) | ORAL | 0 refills | 0 days | Status: CP
Start: 2017-05-06 — End: 2017-05-26

## 2017-05-09 MED FILL — GLIPIZIDE/10MG/TABS: GLIPIZIDE/10MG/TABS | 90 days supply | Qty: 180 | Fill #1

## 2017-05-26 MED ORDER — METHADONE 10 MG TABLET
ORAL_TABLET | Freq: Four times a day (QID) | ORAL | 0 refills | 0 days | Status: CP
Start: 2017-05-26 — End: 2017-06-07

## 2017-05-30 NOTE — Unmapped (Signed)
[facility] Refill Coordination Note  Specialty Medication(s): GENVOYA      Marshia Tropea, DOB: [birth year]  Phone: [phone] (home) , Alternate phone contact: N/A  Phone or address changes today?: No  All above HIPAA information was verified with patient.  Shipping Address: [address]  [place] [address]   Insurance changes? No    Completed refill call assessment today to schedule patient's medication shipment from the [facility] [phone]).      Confirmed the medication and dosage are correct and have not changed: Yes, regimen is correct and unchanged.    Confirmed patient started or stopped the following medications in the past month:  No, there are no changes reported at this time.    Are you tolerating your medication?:  Kumari reports tolerating the medication.    ADHERENCE        Did you miss any doses in the past 4 weeks? No missed doses reported.    FINANCIAL/SHIPPING    Delivery Scheduled: Yes, Expected medication delivery date: 06/05/17     The patient will receive an FSI print out for each medication shipped and additional FDA Medication Guides as required.  Patient education from [place] or Robet Leu may also be included in the shipment    [place] did not have any additional questions at this time.    Delivery address validated in FSI scheduling system: Yes, address listed in FSI is correct.    We will follow up with patient monthly for standard refill processing and delivery.      Thank you,  [name]   [facility] Shared [facility] Specialty Technician

## 2017-06-02 MED FILL — GENVOYA/150-150-200-1/TABS: GENVOYA/150-150-200-1/TABS | 30 days supply | Qty: 30 | Fill #0

## 2017-06-07 ENCOUNTER — Ambulatory Visit
Admit: 2017-06-07 | Discharge: 2017-06-08 | Payer: MEDICARE | Attending: Infectious Disease | Primary: Infectious Disease

## 2017-06-07 DIAGNOSIS — I10 Essential (primary) hypertension: Secondary | ICD-10-CM

## 2017-06-07 DIAGNOSIS — E1165 Type 2 diabetes mellitus with hyperglycemia: Secondary | ICD-10-CM

## 2017-06-07 DIAGNOSIS — B20 Human immunodeficiency virus [HIV] disease: Principal | ICD-10-CM

## 2017-06-07 DIAGNOSIS — J9801 Acute bronchospasm: Secondary | ICD-10-CM

## 2017-06-07 DIAGNOSIS — Z794 Long term (current) use of insulin: Secondary | ICD-10-CM

## 2017-06-07 DIAGNOSIS — K219 Gastro-esophageal reflux disease without esophagitis: Secondary | ICD-10-CM

## 2017-06-07 DIAGNOSIS — E1122 Type 2 diabetes mellitus with diabetic chronic kidney disease: Secondary | ICD-10-CM

## 2017-06-07 DIAGNOSIS — N183 Chronic kidney disease, stage 3 (moderate): Secondary | ICD-10-CM

## 2017-06-07 DIAGNOSIS — G629 Polyneuropathy, unspecified: Secondary | ICD-10-CM

## 2017-06-07 DIAGNOSIS — G894 Chronic pain syndrome: Secondary | ICD-10-CM

## 2017-06-07 LAB — GLUCOSE RANDOM: Glucose:MCnc:Pt:Ser/Plas:Qn:: 304 — ABNORMAL HIGH

## 2017-06-07 LAB — EGFR MDRD NON AF AMER: Glomerular filtration rate/1.73 sq M.predicted.non black:ArVRat:Pt:Ser/Plas/Bld:Qn:Creatinine-based formula (MDRD): >=60

## 2017-06-07 LAB — CBC W/ AUTO DIFF
BASOPHILS ABSOLUTE COUNT: 0.1 10*9/L (ref 0.0–0.1)
BASOPHILS RELATIVE PERCENT: 0.4 %
EOSINOPHILS ABSOLUTE COUNT: 0.2 10*9/L (ref 0.0–0.4)
EOSINOPHILS RELATIVE PERCENT: 1.7 %
HEMATOCRIT: 35.9 % — ABNORMAL LOW (ref 36.0–46.0)
HEMOGLOBIN: 11.1 g/dL — ABNORMAL LOW (ref 12.0–16.0)
LARGE UNSTAINED CELLS: 1 % (ref 0–4)
LYMPHOCYTES ABSOLUTE COUNT: 3 10*9/L (ref 1.5–5.0)
LYMPHOCYTES RELATIVE PERCENT: 26.1 %
MEAN CORPUSCULAR HEMOGLOBIN CONC: 31.1 g/dL (ref 31.0–37.0)
MEAN CORPUSCULAR HEMOGLOBIN: 28.9 pg (ref 26.0–34.0)
MEAN PLATELET VOLUME: 8.5 fL (ref 7.0–10.0)
MONOCYTES ABSOLUTE COUNT: 0.5 10*9/L (ref 0.2–0.8)
MONOCYTES RELATIVE PERCENT: 4.1 %
NEUTROPHILS ABSOLUTE COUNT: 7.7 10*9/L — ABNORMAL HIGH (ref 2.0–7.5)
NEUTROPHILS RELATIVE PERCENT: 66.4 %
RED BLOOD CELL COUNT: 3.86 10*12/L — ABNORMAL LOW (ref 4.00–5.20)
RED CELL DISTRIBUTION WIDTH: 13.5 % (ref 12.0–15.0)
WBC ADJUSTED: 11.6 10*9/L — ABNORMAL HIGH (ref 4.5–11.0)

## 2017-06-07 LAB — MEAN CORPUSCULAR HEMOGLOBIN: Lab: 28.9

## 2017-06-07 LAB — AST (SGOT): Aspartate aminotransferase:CCnc:Pt:Ser/Plas:Qn:: 18

## 2017-06-07 LAB — BILIRUBIN TOTAL: Bilirubin:MCnc:Pt:Ser/Plas:Qn:: 0.4

## 2017-06-07 LAB — CREATININE: EGFR MDRD NON AF AMER: >=60 mL/min/{1.73_m2} (ref >=60)

## 2017-06-07 LAB — ALT (SGPT): Alanine aminotransferase:CCnc:Pt:Ser/Plas:Qn:: 27

## 2017-06-07 LAB — SMEAR REVIEW

## 2017-06-07 MED ORDER — GLIPIZIDE 10 MG TABLET
ORAL_TABLET | Freq: Two times a day (BID) | ORAL | 11 refills | 0.00000 days | Status: CP
Start: 2017-06-07 — End: 2018-06-15

## 2017-06-07 MED ORDER — OMEPRAZOLE 40 MG CAPSULE,DELAYED RELEASE
ORAL_CAPSULE | Freq: Every day | ORAL | 99 refills | 0.00000 days | Status: CP
Start: 2017-06-07 — End: 2018-06-15

## 2017-06-07 MED ORDER — PRAVASTATIN 20 MG TABLET: 20 mg | tablet | 8 refills | 0 days | Status: AC

## 2017-06-07 MED ORDER — OMEPRAZOLE 40 MG CAPSULE,DELAYED RELEASE: 40 mg | capsule | 99 refills | 0 days | Status: AC

## 2017-06-07 MED ORDER — INSULIN GLARGINE (U-100) 100 UNIT/ML (3 ML) SUBCUTANEOUS PEN
99 refills | 0.00000 days | Status: CP
Start: 2017-06-07 — End: 2018-06-15

## 2017-06-07 MED ORDER — METFORMIN 1,000 MG TABLET: 1000 mg | tablet | Freq: Two times a day (BID) | 3 refills | 0 days | Status: AC

## 2017-06-07 MED ORDER — METHADONE 10 MG TABLET
ORAL_TABLET | Freq: Four times a day (QID) | ORAL | 0 refills | 0.00000 days | Status: CP
Start: 2017-06-07 — End: 2017-06-07

## 2017-06-07 MED ORDER — ALBUTEROL SULFATE HFA 90 MCG/ACTUATION AEROSOL INHALER
Freq: Four times a day (QID) | RESPIRATORY_TRACT | 0 refills | 0.00000 days | Status: CP | PRN
Start: 2017-06-07 — End: 2017-06-29

## 2017-06-07 MED ORDER — ELVITEG 150 MG-COB 150 MG-EMTRICIT 200 MG-TENOFO ALAFENAM 10 MG TABLET
ORAL_TABLET | Freq: Every day | ORAL | 8 refills | 0.00000 days | Status: CP
Start: 2017-06-07 — End: 2017-06-07

## 2017-06-07 MED ORDER — CLONIDINE HCL 0.2 MG TABLET: tablet | 99 refills | 0 days | Status: AC

## 2017-06-07 MED ORDER — METHADONE 10 MG TABLET: 10 mg | tablet | Freq: Four times a day (QID) | 0 refills | 0 days | Status: AC

## 2017-06-07 MED ORDER — CLONIDINE HCL 0.2 MG TABLET
ORAL_TABLET | Freq: Two times a day (BID) | ORAL | 99 refills | 0.00000 days | Status: CP
Start: 2017-06-07 — End: 2018-06-15

## 2017-06-07 MED ORDER — METFORMIN 1,000 MG TABLET
Freq: Two times a day (BID) | ORAL | 3 refills | 0.00000 days | Status: CP
Start: 2017-06-07 — End: 2018-10-09

## 2017-06-07 MED ORDER — ASPIRIN 81 MG TABLET,DELAYED RELEASE: each | 3 refills | 0 days | Status: AC

## 2017-06-07 MED ORDER — ACYCLOVIR 400 MG TABLET
ORAL_TABLET | Freq: Three times a day (TID) | ORAL | 10 refills | 0.00000 days | Status: CP | PRN
Start: 2017-06-07 — End: 2018-06-15

## 2017-06-07 MED ORDER — PRAVASTATIN 20 MG TABLET
ORAL_TABLET | Freq: Every day | ORAL | 8 refills | 0.00000 days | Status: CP
Start: 2017-06-07 — End: 2018-06-15

## 2017-06-07 MED ORDER — AMITRIPTYLINE 100 MG TABLET
ORAL_TABLET | Freq: Every evening | ORAL | PRN refills | 0.00000 days | Status: CP | PRN
Start: 2017-06-07 — End: 2017-06-07

## 2017-06-07 MED ORDER — ACYCLOVIR 400 MG TABLET: 400 mg | tablet | Freq: Three times a day (TID) | 10 refills | 0 days | Status: AC

## 2017-06-07 MED ORDER — ASPIRIN 81 MG TABLET,DELAYED RELEASE
Freq: Every day | ORAL | 3 refills | 0.00000 days | Status: CP
Start: 2017-06-07 — End: 2018-06-15

## 2017-06-07 MED ORDER — GLIPIZIDE 10 MG TABLET: 10 mg | tablet | Freq: Two times a day (BID) | 11 refills | 0 days | Status: AC

## 2017-06-07 MED ORDER — INSULIN GLARGINE (U-100) 100 UNIT/ML (3 ML) SUBCUTANEOUS PEN: mL | 99 refills | 0 days | Status: AC

## 2017-06-07 MED ORDER — ELVITEG 150 MG-COB 150 MG-EMTRICIT 200 MG-TENOFO ALAFENAM 10 MG TABLET: 1 | tablet | 8 refills | 0 days

## 2017-06-07 MED ORDER — AMITRIPTYLINE 100 MG TABLET: 100 mg | tablet | Freq: Every evening | 99 refills | 0 days | Status: AC

## 2017-06-07 NOTE — Unmapped (Signed)
57 yo woman, initially presented HIV+ with Crypto meningitis. Her son was also found to be HIV+ and died at age 75 of AIDS, ca. 2003/09/06. Husband x 17 yrs, has always been HIV-negative practicing safe sex. Long term ART, few details, but never failed therapy, HIV RNA suppressed x years. Has been on qd Epizcom and Sustiva for years, despite some chronic HA she attributes to these meds.Was on suppressive Diflucan.    Doing generally well, feels she has better control of DM, reduced smoking.    Depression: going to counselor at [facility] in [place], going well  DM: Treated with oral agents. Metformin, Glipizide bid. Now at [facility] Endo clinic, added qhs insulin 20U   Hypercholesterolemia: Pravastatin qd  HTN: On Clonidine 0.2 bid  GERD: Omeprazole 40 > prn  Chronic pain syndrome and Neuropathy: Dx and duration unclear, but likely 2ndary to diabetes. Has been getting Methadone 10 mg qid x several years and now stable/managable. Carries Dx also distinct peripheral neuropathy as well, cause unclear. Amtriptyline 100 qhs  Bilateral hearing loss: ringing, cause, hx unclear  HSV II: Perirectal, prn ACV. Perioroal lesions now resolving on ACV  Smoking: few cigarettes every few days, says a pack lasts several weeks.  Hysterectomy 1995    ALLERGIES: vicodoin. tramadol    MEDS:  acyclovir (ZOVIRAX) 400 MG Three (3) times a day as needed. Not used recently  amitriptyline (ELAVIL) 100 MG nightly.   Genvoya qd  cloNIDine HCl (CATAPRES) 0.2 MG bid   glipiZIDE (GLUCOTROL) 10 MG bid  metFORMIN (GLUCOPHAGE) 1000 MG bid  methadone (DOLOPHINE) 10 MG tablet qid  Omeprazole (PRILOSEC) 40 MG qd prn, taking less and less  pravastatin (PRAVACHOL) 20 MG qd  Long acting Insulin on sliding scale 47U at night    PHYSICAL EXAMINATION:   AVSS as above   GENERAL: NAD, pleasant  HEENT: [place]/AT, PERRLA, EOMI, Orophar cl, dentition good, no oral lesions , fundi normal  Chest: Lungs clear throughout   Cor RRR S1S2   Breasts no masses  Abd soft nontender, no HSM NABS   Gen/Rect nl ext exam  Ext No CCE no lesion, nl jts   Neuro no abnormailities, CrNN 2-12 intact, Nl strength, sensation distally     A/P: Seems quite stable, little has changed  Cough: reactive airway post URI, 1 month Albuterol  HIV:  Doing well, labs last time showed HIV RNA < 50 and CD4 1100 >> recheck HIV RNA & CD$    Switch to Genvoya seems to be doing well  DM:  Insulin as per Endo >> provider in [place]  Chronic pain: Will refill methadone monthly  HTN  No change, stable  Lipids:   LDL cholesterol 74 last  HSV  Stable  GERD  Stable using little PPI  HCM:  Mammogram [place] Dec 2016, wishes local referral in [place]    Also refer for pap (none x yrs post hysterectomy) HER REQUEST, but will do here if not done by next visit    Colonoscopy in 2011/09/06    Wt loss, exercise >> lost some more lbs    Try to get [facility] coverage for dental implants needed    RTC 6 months

## 2017-06-08 LAB — HEMOGLOBIN A1C
ESTIMATED AVERAGE GLUCOSE: 309 mg/dL
Hemoglobin A1c/Hemoglobin.total:MFr:Pt:Bld:Qn:: 12.4 — ABNORMAL HIGH

## 2017-06-08 MED FILL — VENTOLIN HFA/90MCG/AER: VENTOLIN HFA/90MCG/AER | 25 days supply | Qty: 1 | Fill #0

## 2017-06-09 LAB — HIV RNA, QUANTITATIVE, PCR

## 2017-06-09 LAB — HIV RNA COMMENT: Lab: 0

## 2017-06-15 NOTE — Unmapped (Signed)
Patient called and said she tried to schedule diabetes appointment at the clinic that we had given her information for but she was told she needed a referral.  I called the [facility] in [place] and they were unable to see our referral via Epic.  I was able to fax them the requested information and they will be in touch with patient to schedule an appointment.    Patient also said she has already scheduled her gyn appointment with them.

## 2017-06-20 ENCOUNTER — Other Ambulatory Visit: Payer: Self-pay | Admitting: Obstetrics and Gynecology

## 2017-06-20 DIAGNOSIS — Z1231 Encounter for screening mammogram for malignant neoplasm of breast: Secondary | ICD-10-CM

## 2017-06-27 ENCOUNTER — Inpatient Hospital Stay: Admission: RE | Admit: 2017-06-27 | Payer: Medicare HMO | Source: Ambulatory Visit

## 2017-06-29 MED ORDER — METHADONE 10 MG TABLET
ORAL_TABLET | Freq: Four times a day (QID) | ORAL | 0 refills | 0.00000 days | Status: CP
Start: 2017-06-29 — End: 2017-07-28

## 2017-06-29 NOTE — Unmapped (Signed)
[facility] Refill Coordination Note  Specialty Medication(s): GENVOYA      Anna Cantrell, DOB: 05/31/1960  Phone: [phone] (home) , Alternate phone contact: N/A  Phone or address changes today?: No  All above HIPAA information was verified with patient.  Shipping Address: [address]  [place] [address]   Insurance changes? No    Completed refill call assessment today to schedule patient's medication shipment from the [facility] [phone]).      Confirmed the medication and dosage are correct and have not changed: Yes, regimen is correct and unchanged.    Confirmed patient started or stopped the following medications in the past month:  No, there are no changes reported at this time.    Are you tolerating your medication?:  June reports tolerating the medication.    ADHERENCE    (Below is required for Medicare Part B or Transplant patients only - per drug):   How many tablets were dispensed last month: 30  Patient currently has 18 remaining.    Did you miss any doses in the past 4 weeks? No missed doses reported.    FINANCIAL/SHIPPING    Delivery Scheduled: Yes, Expected medication delivery date: 07/06/17     The patient will receive an FSI print out for each medication shipped and additional FDA Medication Guides as required.  Patient education from [place] or Robet Leu may also be included in the shipment    [place] did not have any additional questions at this time.    Delivery address validated in FSI scheduling system: Yes, address listed in FSI is correct.    We will follow up with patient monthly for standard refill processing and delivery.      Thank you,  [name]   [facility] Shared [facility] Specialty Technician

## 2017-06-30 MED ORDER — VENTOLIN HFA 90 MCG/ACTUATION AEROSOL INHALER
PRN refills | 0 days | Status: CP
Start: 2017-06-30 — End: 2017-06-30

## 2017-06-30 MED ORDER — ALBUTEROL SULFATE HFA 90 MCG/ACTUATION AEROSOL INHALER
99 refills | 0 days | Status: CP
Start: 2017-06-30 — End: 2018-09-10

## 2017-07-04 MED FILL — GENVOYA/150-150-200-1/TABS: GENVOYA/150-150-200-1/TABS | 30 days supply | Qty: 30 | Fill #1

## 2017-07-04 NOTE — Unmapped (Signed)
Pt completed RW paperwork. Eligible for RW B&C grant services and Caps on Charges. IPL;FPL=73%. Expires 12/04/17    Sherene Sires    Time Duration of intervention in minutes: 10 mins

## 2017-07-11 MED FILL — PRAVASTATIN SODIUM/20MG/TABS: PRAVASTATIN SODIUM/20MG/TABS | 90 days supply | Qty: 90 | Fill #1

## 2017-07-11 MED FILL — AMITRIPTYLINE HCL/100MG/TABS: AMITRIPTYLINE HCL/100MG/TABS | 90 days supply | Qty: 90 | Fill #0

## 2017-07-28 MED ORDER — METHADONE 10 MG TABLET
ORAL_TABLET | Freq: Four times a day (QID) | ORAL | 0 refills | 0 days | Status: CP
Start: 2017-07-28 — End: 2017-08-23

## 2017-07-28 NOTE — Unmapped (Signed)
[facility] Refill Coordination Note  Specialty Medication(s): Darrin Luis  Additional Medications shipped: None    Anna Cantrell, DOB: 07/30/1960  Phone: [phone] (home) , Alternate phone contact: N/A  Phone or address changes today?: No  All above HIPAA information was verified with patient.  Shipping Address: [address]  [place] [address]   Insurance changes? No    Completed refill call assessment today to schedule patient's medication shipment from the [facility] [phone]).      Confirmed the medication and dosage are correct and have not changed: Yes, regimen is correct and unchanged.    Confirmed patient started or stopped the following medications in the past month:  No, there are no changes reported at this time.    Are you tolerating your medication?:  Anna Cantrell reports tolerating the medication.    ADHERENCE    Did you miss any doses in the past 4 weeks? No missed doses reported.    FINANCIAL/SHIPPING    Delivery Scheduled: Yes, Expected medication delivery date: 08/03/17 COURIER    The patient will receive an FSI print out for each medication shipped and additional FDA Medication Guides as required.  Patient education from [place] or Robet Leu may also be included in the shipment    [place] did not have any additional questions at this time.    Delivery address validated in FSI scheduling system: Yes, address listed in FSI is correct.    We will follow up with patient monthly for standard refill processing and delivery.      Thank you,  Burnett Corrente   [facility] Specialty PharmD Candidate

## 2017-08-03 MED FILL — GENVOYA/150-150-200-1/TABS: GENVOYA/150-150-200-1/TABS | 30 days supply | Qty: 30 | Fill #2

## 2017-08-07 MED FILL — METFORMIN HCL/1000MG/TABS: METFORMIN HCL/1000MG/TABS | 90 days supply | Qty: 180 | Fill #2

## 2017-08-24 MED ORDER — METHADONE 10 MG TABLET
ORAL_TABLET | Freq: Four times a day (QID) | ORAL | 0 refills | 0.00000 days | Status: CP
Start: 2017-08-24 — End: 2017-09-15

## 2017-08-25 MED FILL — NOVOFINE PLUS 32GX4MM/32GX4MM/MISC: NOVOFINE PLUS 32GX4MM/32GX4MM/MISC | 90 days supply | Qty: 100 | Fill #1

## 2017-08-25 NOTE — Unmapped (Signed)
[facility] Refill and Clinical Coordination Note: HIV     HIV Medication(s): Genvoya  Additional Medication(s): pen tips    Anna Cantrell, DOB: 04-Apr-1960  Phone: [phone] (home) , Alternate phone contact: N/A  Shipping address: [address]  [place] [address]  Phone or address changes today?: No  All above HIPAA information verified.  Insurance changes? No    Completed refill and clinical call assessment today to schedule patient's medication shipment from the [facility] [phone]).      MEDICATION RECONCILIATION    Confirmed the medication and dosage are correct and have not changed: Yes, regimen is correct and unchanged.    Were there any changes to your medication(s) in the past month:  No, there are no changes reported at this time.    HIV-related labs:  Lab Results   Component Value Date/Time    HIVRS Not Detected 06/07/2017 02:43 PM    HIVRS Not Detected 12/07/2016 03:12 PM    HIVRS Not Detected 03/30/2016 03:13 PM    HIVCM  06/07/2017 02:43 PM      Comment:      HIV-1 quantification by real-time RT-PCR is performed using the Abbott RealTime  HIV-1 test. This test is FDA approved and can quantitate HIV-1 RNA over the  range of 40 - 1,000,000 copies/mL (1.60 log(10) -6.00 log(10) copies/mL). The reference range for this assay is Not Detected.    HIVCM  12/07/2016 03:12 PM      Comment:      HIV-1 quantification by real-time RT-PCR is performed using the Abbott RealTime  HIV-1 test. This test is FDA approved and can quantitate HIV-1 RNA over the  range of 40 - 1,000,000 copies/mL (1.60 log(10) -6.00 log(10) copies/mL). The reference range for this assay is Not Detected.    HIVCM  03/30/2016 03:13 PM      Comment:      HIV-1 quantification by real-time RT-PCR is performed using the Abbott RealTime  HIV-1 test. This test is FDA approved and can quantitate HIV-1 RNA over the  range of 40 - 1,000,000 copies/mL (1.60 log(10) -6.00 log(10) copies/mL). The reference range for this assay is Not Detected.    ACD4 1,594 12/07/2016 03:12 PM    ACD4 1,245 03/30/2016 03:13 PM    ACD4 1,873 03/25/2015 01:12 PM         ADHERENCE      Number of tablets of HIV Medication(s) at home: 20    Did you miss any doses in the past 4 weeks? No missed doses reported.  Adherence counseling provided? Not needed     SIDE EFFECT MANAGEMENT    Are you tolerating your medication?:   Anna Cantrell reports tolerating the medication.  Side effect management discussed: None      Therapy is appropriate and should be continued.    The patient will receive an FSI print out for each medication shipped and additional FDA Medication Guides as required.  Patient education from [place] or Robet Leu may also be included in the shipment.    Evidence of clinical benefit: See Epic note from 06/07/17      FINANCIAL/SHIPPING    Delivery Scheduled: Yes, Expected medication delivery date: Genvoya: 08/31/17 and pen tips on 08/28/17   Additional medications refilled: No additional medications/refills needed at this time.    Anna Cantrell did not have any additional questions at this time.      Delivery address validated in FSI scheduling system: Yes, address listed above is  correct.      We will follow up with patient monthly for standard refill processing and delivery.      Thank you,  [name]   [facility] Shared [facility] Specialty Pharmacist

## 2017-09-15 MED ORDER — METHADONE 10 MG TABLET
ORAL_TABLET | Freq: Four times a day (QID) | ORAL | 0 refills | 0 days | Status: CP
Start: 2017-09-15 — End: 2017-10-16

## 2017-09-15 NOTE — Unmapped (Signed)
genvoya was not sent to patient   Sending out 7/16  Refer to encounter from 7/12

## 2017-09-15 NOTE — Unmapped (Signed)
Patient called asking about genvoya-she was expecting a delivery  Sending out Monday for delivery on Tuesday  Also requested glipizide  addending note from greg saying genvoya would go out in june      [facility] Refill Coordination Note    Specialty Medication(s) to be Shipped:   Infectious Disease: Genvoya    Other medication(s) to be shipped: glipizide     Charletta Cousin, DOB: 1960/07/24  Phone: [phone] (home)   Shipping Address: [address]  [place] [address]    All above HIPAA information was verified with patient.     Completed refill call assessment today to schedule patient's medication shipment from the [facility] [phone]).       Specialty medication(s) and dose(s) confirmed: Regimen is correct and unchanged.   Changes to medications: Kamilia reports no changes reported at this time.  Changes to insurance: No  Questions for the pharmacist: No    The patient will receive an FSI print out for each medication shipped and additional FDA Medication Guides as required.  Patient education from [place] or Robet Leu may also be included in the shipment.    DISEASE-SPECIFIC INFORMATION        patient has 6 days of genvoya on hand    ADHERENCE        Pt has not missed any doses   SHIPPING     Shipping address confirmed in FSI.     Delivery Scheduled: Yes, Expected medication delivery date: 7/16 via UPS or courier. nd courier    Renette Butters   [facility] Shared [facility] Specialty Technician

## 2017-09-17 MED FILL — GENVOYA/150-150-200-1/TABS: GENVOYA/150-150-200-1/TABS | 30 days supply | Qty: 30 | Fill #3

## 2017-09-17 MED FILL — GLIPIZIDE/10MG/TABS: GLIPIZIDE/10MG/TABS | 90 days supply | Qty: 180 | Fill #2

## 2017-10-16 NOTE — Unmapped (Signed)
Elide called this SW to check in about her upcoming appointment with Dr. Carita Pian. She shared that she follows up with Dr. Carita Pian every 6 months, so she's due for an appointment. This SW shared that her next appointment is on 12/13/17 at 1:00 pm. Suzette Battiest asked for an appointment reminder, so this SW mailed her one. No further SW needs identified at this time.    Duration of intervention: 10 minutes      Dayja Loveridge, LCSWA, CCM

## 2017-10-17 MED ORDER — METHADONE 10 MG TABLET
ORAL_TABLET | Freq: Four times a day (QID) | ORAL | 0 refills | 0 days | Status: CP
Start: 2017-10-17 — End: 2017-11-13

## 2017-10-20 NOTE — Unmapped (Signed)
[facility] Refill Coordination Note  Specialty Medication(s): GENVOYA  AMITRIPTYLINE  PRAVASTATIN        Anna Cantrell, DOB: [birth year]  Phone: [phone] (home) , Alternate phone contact: N/A  Phone or address changes today?: No  All above HIPAA information was verified with patient.  Shipping Address: [address]  [place] [address]   Insurance changes? No    Completed refill call assessment today to schedule patient's medication shipment from the [facility] [phone]).      Confirmed the medication and dosage are correct and have not changed: Yes, regimen is correct and unchanged.    Confirmed patient started or stopped the following medications in the past month:  No, there are no changes reported at this time.    Are you tolerating your medication?:  Anna Cantrell reports tolerating the medication.    ADHERENCE        Did you miss any doses in the past 4 weeks? No missed doses reported.    FINANCIAL/SHIPPING    Delivery Scheduled: Yes, Expected medication delivery date: 8/20     The patient will receive a drug information handout for each medication shipped and additional FDA Medication Guides as required.      Anna Cantrell did not have any additional questions at this time.    Delivery address validated in Epic.    We will follow up with patient monthly for standard refill processing and delivery.      Thank you,  [name]   [facility] Shared [facility] Specialty Technician

## 2017-10-23 MED FILL — PRAVASTATIN 20 MG TABLET: ORAL | 90 days supply | Qty: 90 | Fill #0

## 2017-10-23 MED FILL — GENVOYA 150 MG-150 MG-200 MG-10 MG TABLET: 30 days supply | Qty: 30 | Fill #0 | Status: AC

## 2017-10-23 MED FILL — PRAVASTATIN 20 MG TABLET: 90 days supply | Qty: 90 | Fill #0 | Status: AC

## 2017-10-23 MED FILL — GENVOYA 150 MG-150 MG-200 MG-10 MG TABLET: ORAL | 30 days supply | Qty: 30 | Fill #0

## 2017-10-23 MED FILL — AMITRIPTYLINE 100 MG TABLET: ORAL | 90 days supply | Qty: 90 | Fill #0

## 2017-10-23 MED FILL — AMITRIPTYLINE 100 MG TABLET: 90 days supply | Qty: 90 | Fill #0 | Status: AC

## 2017-11-01 MED ORDER — BENAZEPRIL 40 MG TABLET
ORAL_TABLET | Freq: Every day | ORAL | 1 refills | 0.00000 days
Start: 2017-11-01 — End: 2018-06-15

## 2017-11-02 MED FILL — BENAZEPRIL 40 MG TABLET: ORAL | 90 days supply | Qty: 90 | Fill #0

## 2017-11-02 MED FILL — BENAZEPRIL 40 MG TABLET: 90 days supply | Qty: 90 | Fill #0 | Status: AC

## 2017-11-14 NOTE — Unmapped (Signed)
[facility] Refill Coordination Note    Specialty Medication(s) to be Shipped:   Infectious Disease: Genvoya    Other medication(s) to be shipped:      Anna Cantrell, DOB: 1960-11-09  Phone: [phone] (home)   Shipping Address: [address]  [place] [address]    All above HIPAA information was verified with patient.     Completed refill call assessment today to schedule patient's medication shipment from the [facility] [phone]).       Specialty medication(s) and dose(s) confirmed: Regimen is correct and unchanged.   Changes to medications: [name] reports no changes reported at this time.  Changes to insurance: No  Questions for the pharmacist: No    The patient will receive a drug information handout for each medication shipped and additional FDA Medication Guides as required.      DISEASE/MEDICATION-SPECIFIC INFORMATION        N/A    ADHERENCE     Medication Adherence    Patient reported X missed doses in the last month:  0  Specialty Medication:  Genvoya #oh-20 tabs  Patient is on additional specialty medications:  No  Patient is on more than two specialty medications:  No  Any gaps in refill history greater than 2 weeks in the last 3 months:  no  Demonstrates understanding of importance of adherence:  yes  Informant:  patient  Reliability of informant:  reliable  Patient is at risk for Non-Adherence:  No  Confirmed plan for next specialty medication refill:  delivery by pharmacy  Refills needed for supportive medications:  not needed          Refill Coordination    Has the Patients' Contact Information Changed:  No  Is the Shipping Address Different:  No         MEDICARE PART B DOCUMENTATION     Not Applicable    SHIPPING     Shipping address confirmed in Epic.     Delivery Scheduled: Yes, Expected medication delivery date: 11/22/17 WFD(ND) via UPS or courier.     Lajean Silvius   [facility] Specialty Technician

## 2017-11-15 MED ORDER — METHADONE 10 MG TABLET
ORAL_TABLET | Freq: Four times a day (QID) | ORAL | 0 refills | 0 days | Status: CP
Start: 2017-11-15 — End: 2017-12-13

## 2017-11-21 MED FILL — GENVOYA 150 MG-150 MG-200 MG-10 MG TABLET: 30 days supply | Qty: 30 | Fill #1 | Status: AC

## 2017-11-21 MED FILL — GENVOYA 150 MG-150 MG-200 MG-10 MG TABLET: ORAL | 30 days supply | Qty: 30 | Fill #1

## 2017-11-22 MED FILL — METFORMIN 1,000 MG TABLET: 90 days supply | Qty: 180 | Fill #0 | Status: AC

## 2017-11-22 MED FILL — METFORMIN 1,000 MG TABLET: ORAL | 90 days supply | Qty: 180 | Fill #0

## 2017-12-13 ENCOUNTER — Ambulatory Visit
Admit: 2017-12-13 | Discharge: 2017-12-14 | Payer: MEDICARE | Attending: Infectious Disease | Primary: Infectious Disease

## 2017-12-13 DIAGNOSIS — Z21 Asymptomatic human immunodeficiency virus [HIV] infection status: Secondary | ICD-10-CM

## 2017-12-13 DIAGNOSIS — E1165 Type 2 diabetes mellitus with hyperglycemia: Secondary | ICD-10-CM

## 2017-12-13 DIAGNOSIS — Z23 Encounter for immunization: Principal | ICD-10-CM

## 2017-12-13 LAB — CBC W/ AUTO DIFF
BASOPHILS ABSOLUTE COUNT: 0.2 10*9/L — ABNORMAL HIGH (ref 0.0–0.1)
BASOPHILS RELATIVE PERCENT: 1.4 %
EOSINOPHILS ABSOLUTE COUNT: 0.2 10*9/L (ref 0.0–0.4)
EOSINOPHILS RELATIVE PERCENT: 1.4 %
HEMOGLOBIN: 12.3 g/dL (ref 12.0–16.0)
LARGE UNSTAINED CELLS: 1 % (ref 0–4)
LYMPHOCYTES ABSOLUTE COUNT: 2.5 10*9/L (ref 1.5–5.0)
LYMPHOCYTES RELATIVE PERCENT: 21.6 %
MEAN CORPUSCULAR HEMOGLOBIN CONC: 30.4 g/dL — ABNORMAL LOW (ref 31.0–37.0)
MEAN CORPUSCULAR HEMOGLOBIN: 28.7 pg (ref 26.0–34.0)
MEAN CORPUSCULAR VOLUME: 94.3 fL (ref 80.0–100.0)
MEAN PLATELET VOLUME: 7.3 fL (ref 7.0–10.0)
MONOCYTES RELATIVE PERCENT: 3.7 %
NEUTROPHILS ABSOLUTE COUNT: 8.1 10*9/L — ABNORMAL HIGH (ref 2.0–7.5)
NEUTROPHILS RELATIVE PERCENT: 70.7 %
PLATELET COUNT: 395 10*9/L (ref 150–440)
RED BLOOD CELL COUNT: 4.27 10*12/L (ref 4.00–5.20)
RED CELL DISTRIBUTION WIDTH: 13.8 % (ref 12.0–15.0)
WBC ADJUSTED: 11.5 10*9/L — ABNORMAL HIGH (ref 4.5–11.0)

## 2017-12-13 LAB — AST (SGOT): Aspartate aminotransferase:CCnc:Pt:Ser/Plas:Qn:: 23

## 2017-12-13 LAB — CREATININE
CREATININE: 1.59 mg/dL — ABNORMAL HIGH (ref 0.60–1.00)
EGFR CKD-EPI AA FEMALE: 41 mL/min/{1.73_m2} — ABNORMAL LOW (ref >=60)

## 2017-12-13 LAB — EOSINOPHILS ABSOLUTE COUNT: Lab: 0.2

## 2017-12-13 LAB — ALT (SGPT): Alanine aminotransferase:CCnc:Pt:Ser/Plas:Qn:: 17

## 2017-12-13 LAB — EGFR CKD-EPI AA FEMALE: Lab: 41 — ABNORMAL LOW

## 2017-12-13 MED ORDER — BICTEGRAVIR 50 MG-EMTRICITABINE 200 MG-TENOFOVIR ALAFENAM 25 MG TABLET
ORAL_TABLET | Freq: Every day | ORAL | 11 refills | 30.00000 days | Status: CP
Start: 2017-12-13 — End: ?
  Filled 2017-12-15: qty 30, 30d supply, fill #0

## 2017-12-13 MED ORDER — METHADONE 10 MG TABLET
ORAL_TABLET | Freq: Four times a day (QID) | ORAL | 0 refills | 0.00000 days | Status: CP
Start: 2017-12-13 — End: 2018-01-07

## 2017-12-13 NOTE — Unmapped (Signed)
57 yo woman, initially presented HIV+ with Crypto meningitis. Her son was also found to be HIV+ and died at age 30 of AIDS, ca. 2003-09-25. Husband x 17 yrs, has always been HIV-negative practicing safe sex. Long term ART, few details, but never failed therapy, HIV RNA suppressed x years. Has been on qd Epizcom and Sustiva for years, despite some chronic HA she attributes to these meds. Was on suppressive Diflucan.    Doing generally well, feels she has better control of DM, reduced smoking.    Depression: going to counselor at [facility] in [place], going well, still struglgling with death of son  DM:. Complex rx managed at [facility] Endo clinic   Hypercholesterolemia: statin rx  HTN: On Clonidine 0.2 bid  GERD: Omeprazole 40 > prn  Chronic pain syndrome and Neuropathy: Dx and duration unclear, but likely 2ndary to diabetes. Has been getting Methadone 10 mg qid x several years and now stable/managable. Carries Dx also distinct peripheral neuropathy as well, cause unclear. Amtriptyline 100 qhs  Bilateral hearing loss: ringing, cause, hx unclear  HSV II: Perirectal, prn ACV. Perioroal lesions now resolving on ACV  Smoking: few cigarettes every few days, says a pack lasts several weeks.  Hysterectomy 1995 >> nl Pap at [facility] 06/23/17    ALLERGIES: vicodoin. tramadol    MEDS:  acyclovir (ZOVIRAX) 400 MG Three (3) times a day as needed. Not used recently  amitriptyline (ELAVIL) 100 MG nightly.   cloNIDine HCl (CATAPRES) 0.2 MG bid   glipiZIDE (GLUCOTROL) 10 MG bid  metFORMIN (GLUCOPHAGE) 1000 MG bid  methadone (DOLOPHINE) 10 MG tablet qid  Omeprazole (PRILOSEC) 40 MG qd prn, taking less and less  pravastatin (PRAVACHOL) 20 MG qd  Long acting Insulin on sliding scale 47U at night  albuterol HFA 90 mcg/actuation inhaler 2 puff   Nightly aspirin (ECOTRIN) 81 MG tablet   benazepril (LOTENSIN) 40 MG tablet 40 mg,  bictegrav-emtricit-tenofov ala (BIKTARVY) 50-200-25 mg tablet 1 tablet, Oral, Daily   JARDIANCE 25 mg Tab 1 tablet, Oral, Daily pioglitazone (ACTOS) 15 MG tablet 15 mg, Oral, Daily     PHYSICAL EXAMINATION:   AVSS as above 110/78  GENERAL: NAD, pleasant  HEENT: [place]/AT, PERRLA, EOMI, Orophar cl, dentition good, no oral lesions , fundi normal  Chest: Lungs clear throughout   Cor RRR S1S2   Breasts no masses  Abd soft nontender, no HSM NABS   Gen/Rect nl ext exam  Ext No CCE no lesion, nl jts   Neuro no abnormailities, CrNN 2-12 intact, Nl strength, sensation distally     A/P: Seems quite stable, little has changed  HIV:  Doing well, labs last time showed HIV RNA < 50 and CD4 1100 >> recheck HIV RNA & CD$    Switch to [place] today  DM:  Insulin as per Endo >> provider in [place]  Chronic pain: Will refill methadone monthly  HTN  No change, stable  Lipids:   LDL cholesterol 74 last  HSV  Stable  GERD  Stable using little PPI  HCM:  Mammogram [place] Dec 2016, planned 09-24-2017 [place]    Pap nl at [facility] 06/23/17    Colonoscopy in 09/25/2011    Wt loss, exercise >> lost some more lbs    Try to get [facility] coverage for dental implants needed    RTC 6 months

## 2017-12-14 LAB — ABSOLUTE CD3 CNT: Lab: 1961

## 2017-12-14 LAB — LYMPH MARKER LIMITED,FLOW
ABSOLUTE CD4 CNT: 994 {cells}/uL (ref 510–2320)
ABSOLUTE CD8 CNT: 941 {cells}/uL (ref 180–1520)
CD3% (T CELLS)": 75 % (ref 61–86)
CD4% (T HELPER)": 38 % (ref 34–58)
CD4:CD8 RATIO: 1.1 (ref 0.9–4.8)
CD8% T SUPPRESR": 36 % (ref 12–38)

## 2017-12-14 LAB — SYPHILIS RPR SCREEN: Reagin Ab:PrThr:Pt:Ser:Ord:RPR: NONREACTIVE

## 2017-12-14 LAB — HEMOGLOBIN A1C: ESTIMATED AVERAGE GLUCOSE: 189 mg/dL

## 2017-12-14 LAB — ESTIMATED AVERAGE GLUCOSE: Estimated average glucose:MCnc:Pt:Bld:Qn:Estimated from glycated hemoglobin: 189

## 2017-12-15 LAB — HIV RNA, QUANTITATIVE, PCR: HIV RNA QNT RSLT: NOT DETECTED

## 2017-12-15 LAB — HIV RNA COMMENT: Lab: 0

## 2017-12-15 MED FILL — BIKTARVY 50 MG-200 MG-25 MG TABLET: 30 days supply | Qty: 30 | Fill #0 | Status: AC

## 2017-12-15 NOTE — Unmapped (Signed)
[facility] Specialty Medication Referral: No PA required    Medication (Brand/Generic): BIKTARVY 50-200-25 MG TABLET     Initial Benefits Investigation Claim completed with resulted information below:  No PA required  Patient ABLE to fill at [facility]  Insurance Company:  [place]  Anticipated Copay: $0.00    As Co-pay is under $25 defined limit, per policy there will be no further investigation of need for financial assistance at this time unless patient requests. This referral has been communicated to the provider and handed off to the [facility] team for further processing and filling of prescribed medication.   ______________________________________________________________________  Please utilize this referral for viewing purposes as it will serve as the central location for all relevant documentation and updates.

## 2017-12-15 NOTE — Unmapped (Signed)
[facility]   Patient Onboarding/Medication Counseling    Anna Cantrell is a 57 y.o. female with HIV who I am counseling today on initiation of therapy.    Medication: Biktarvy   (changing from [place] to [place])    Verified patient's date of birth / HIPAA.      Education Provided: ?    Dose/Administration discussed: Yes. This medication should be taken  without regard to food.  Stressed the importance of taking medication as prescribed and to contact provider if that changes at any time.  Discussed missed dose instructions.    Storage requirements: this medicine should be stored at room temperature.     Side effects / precautions discussed: Discussed common side effects, including headache, diarrhea, and upset stomach . If patient experiences signs of kidney or liver problems, signs of allergic reaction, and signs of lactic acidosis, they need to call the doctor.  Patient will receive a drug information handout with shipment.    Handling precautions / disposal reviewed:  n/a.    Drug Interactions: other medications reviewed and up to date in Epic.  No drug interactions identified.    Comorbidities/Allergies: reviewed and up to date in Epic.    Verified therapy is appropriate and should continue      Delivery Information    Medication Assistance provided: none    Anticipated copay of $0.00 reviewed with patient. Verified delivery address in Epic.    Scheduled delivery date: 12/18/17 via UPS    Explained that medication will be delivered by UPS. and this shipment will not require a signature.      Explained the services we provide at [facility] and that each month we would call to set up refills.  Stressed importance of returning phone calls so that we could ensure they receive their medications in time each month.  Informed patient that we should be setting up refills 7-10 days prior to when they will run out of medication.  Informed patient that welcome packet will be sent.      Patient verbalized understanding of the above information as well as how to contact the pharmacy at [phone] option 4 with any questions/concerns.  The pharmacy is open Monday through Friday 8:30am-4:30pm.  A pharmacist is available 24/7 via pager to answer any clinical questions they may have.        Patient Specific Needs      ? Patient has no physical, cognitive, or cultural barriers.    ? Patient prefers to have medications discussed with  Patient     ? Patient is able to read and understand education materials at a high school level or above.    ? Patient's primary language is  [name]  [facility] Shared [facility] Specialty Pharmacist

## 2017-12-19 NOTE — Unmapped (Signed)
Pt completed RW paperwork. Eligible for RW B&C grant services and Caps on Charges. IPL;FPL=73%. Expires 06/05/2018    RW Eligibility Form regarding IPL/FPL and Caps on Charges amount was mailed to the patient.     Sherene Sires    Time Duration of intervention in minutes: 10 mins

## 2017-12-20 NOTE — Unmapped (Signed)
Called to discvuss elevated creat with patient, did not reach her and left message

## 2018-01-09 MED ORDER — METHADONE 10 MG TABLET
ORAL_TABLET | Freq: Four times a day (QID) | ORAL | 0 refills | 0.00000 days | Status: CP
Start: 2018-01-09 — End: 2018-02-08

## 2018-01-16 MED FILL — GLIPIZIDE 10 MG TABLET: 90 days supply | Qty: 180 | Fill #0

## 2018-01-16 MED FILL — GLIPIZIDE 10 MG TABLET: 90 days supply | Qty: 180 | Fill #0 | Status: AC

## 2018-01-16 NOTE — Unmapped (Signed)
Spoke with patient today about getting a refill on her Biktarvy.  Patient stated that she had 22 tablets left and did not need any at this time.  She stated that she had so many left because she was switching from [place] to [place] and she was finishing up the [place].  She also stated that she had been taking both at the same time.  I told her that she should discontinue the [place] completely and continue to take the [place] from now on.  She acknowledged understanding and would just do the [place].  Will follow up with her in about 10 days.

## 2018-02-06 MED FILL — BENAZEPRIL 40 MG TABLET: ORAL | 90 days supply | Qty: 90 | Fill #1

## 2018-02-06 MED FILL — BIKTARVY 50 MG-200 MG-25 MG TABLET: 30 days supply | Qty: 30 | Fill #1 | Status: AC

## 2018-02-06 MED FILL — BIKTARVY 50 MG-200 MG-25 MG TABLET: ORAL | 30 days supply | Qty: 30 | Fill #1

## 2018-02-06 MED FILL — NOVOFINE PLUS 32 GAUGE X 1/6" NEEDLE: 90 days supply | Qty: 100 | Fill #0 | Status: AC

## 2018-02-06 MED FILL — NOVOFINE PLUS 32 GAUGE X 1/6" NEEDLE: 90 days supply | Qty: 100 | Fill #0

## 2018-02-06 MED FILL — LANTUS SOLOSTAR U-100 INSULIN 100 UNIT/ML (3 ML) SUBCUTANEOUS PEN: 90 days supply | Qty: 90 | Fill #0 | Status: AC

## 2018-02-06 MED FILL — BENAZEPRIL 40 MG TABLET: 90 days supply | Qty: 90 | Fill #1 | Status: AC

## 2018-02-06 MED FILL — LANTUS SOLOSTAR U-100 INSULIN 100 UNIT/ML (3 ML) SUBCUTANEOUS PEN: 90 days supply | Qty: 90 | Fill #0

## 2018-02-06 NOTE — Unmapped (Signed)
[facility] Refill and Clinical Coordination Note  Medication(s): Biktarvy 50-200-25mg   Additional Medication: Lantus,Novofine Needles,Benazepril    Anna Cantrell, DOB: May 11, 1960  Phone: [phone] (home) , Alternate phone contact: N/A  Shipping address: [address]  [place] [address]  Phone or address changes today?: No  All above HIPAA information verified.  Insurance changes? No    Completed refill and clinical call assessment today to schedule patient's medication shipment from the [facility] [phone]).      MEDICATION RECONCILIATION    Confirmed the medication and dosage are correct and have not changed: Yes, regimen is correct and unchanged.    Were there any changes to your medication(s) in the past month:  No, there are no changes reported at this time.    ADHERENCE    Is this medicine transplant or covered by Medicare Part B? No.    Biktarvy 50-200-25mg : Patient has 4 days worth of medication on hand.    Did you miss any doses in the past 4 weeks? No missed doses reported.  Adherence counseling provided? Not needed     SIDE EFFECT MANAGEMENT    Are you tolerating your medication?:  Anna Cantrell reports tolerating the medication.  Side effect management discussed: None      Therapy is appropriate and should be continued.    Evidence of clinical benefit: See Epic note from 12/13/17      FINANCIAL/SHIPPING    Delivery Scheduled: Yes, Expected medication delivery date: 02/07/18     Medication will be delivered via UPS to the home address in [place].    Additional medications refilled: Lantus, Benazepril, Novofine Needles    The patient will receive a drug information handout for each medication shipped and additional FDA Medication Guides as required.      Anna Cantrell did not have any additional questions at this time.    Delivery address confirmed in Epic.     We will follow up with patient monthly for standard refill processing and delivery.      Thank you, Tera Helper   [facility] Specialty Pharmacist

## 2018-02-09 MED ORDER — METHADONE 10 MG TABLET
ORAL_TABLET | Freq: Four times a day (QID) | ORAL | 0 refills | 0 days | Status: CP
Start: 2018-02-09 — End: 2018-03-06

## 2018-03-01 NOTE — Unmapped (Addendum)
[facility] Refill Coordination Note  Specialty Medication(s): Biktarvy [id number]  Additional Medications shipped: Amitriptyline 100mg , Ventolin HFA    Anna Cantrell, DOB: [birth year]  Phone: [phone] (home) , Alternate phone contact: N/A  Phone or address changes today?: No  All above HIPAA information was verified with patient.  Shipping Address: [address]  [place] [address]   Insurance changes? No    Completed refill call assessment today to schedule patient's medication shipment from the [facility] [phone]).      Confirmed the medication and dosage are correct and have not changed: Yes, regimen is correct and unchanged.    Confirmed patient started or stopped the following medications in the past month:  No, there are no changes reported at this time.    Are you tolerating your medication?:  Anna Cantrell reports tolerating the medication.    ADHERENCE    (Below is required for Medicare Part B or Transplant patients only - per drug): Biktarvy 50-200-25  How many tablets were dispensed last month: 30 tabs  Patient currently has 6 tabs remaining.    Did you miss any doses in the past 4 weeks? No missed doses reported.    FINANCIAL/SHIPPING    Delivery Scheduled: Yes, Expected medication delivery date: [id number]     Medication will be delivered via Next Day Courier to the home address in [facility].    The patient will receive a drug information handout for each medication shipped and additional FDA Medication Guides as required.      [name] did not have any additional questions at this time.    We will follow up with patient monthly for standard refill processing and delivery.      Thank you,  [name]   [facility] Specialty Technician

## 2018-03-05 MED FILL — VENTOLIN HFA 90 MCG/ACTUATION AEROSOL INHALER: 25 days supply | Qty: 18 | Fill #0

## 2018-03-05 MED FILL — BIKTARVY 50 MG-200 MG-25 MG TABLET: 30 days supply | Qty: 30 | Fill #2 | Status: AC

## 2018-03-05 MED FILL — AMITRIPTYLINE 100 MG TABLET: ORAL | 90 days supply | Qty: 90 | Fill #1

## 2018-03-05 MED FILL — VENTOLIN HFA 90 MCG/ACTUATION AEROSOL INHALER: 25 days supply | Qty: 18 | Fill #0 | Status: AC

## 2018-03-05 MED FILL — AMITRIPTYLINE 100 MG TABLET: 90 days supply | Qty: 90 | Fill #1 | Status: AC

## 2018-03-05 MED FILL — BIKTARVY 50 MG-200 MG-25 MG TABLET: ORAL | 30 days supply | Qty: 30 | Fill #2

## 2018-03-06 MED ORDER — METHADONE 10 MG TABLET
ORAL_TABLET | Freq: Four times a day (QID) | ORAL | 0 refills | 0 days | Status: CP
Start: 2018-03-06 — End: 2018-03-13

## 2018-03-13 MED ORDER — METHADONE 10 MG TABLET
ORAL_TABLET | Freq: Four times a day (QID) | ORAL | 0 refills | 0.00000 days | Status: CP
Start: 2018-03-13 — End: 2018-04-04

## 2018-03-27 NOTE — Unmapped (Signed)
[facility] Refill Coordination Note  Specialty Medication(s): BIKTARVY  PRAVASTATIN      Zo Loudon, DOB: [birth year]  Phone: [phone] (home) , Alternate phone contact: N/A  Phone or address changes today?: No  All above HIPAA information was verified with patient.  Shipping Address: [address]  [place] [address]   Insurance changes? No    Completed refill call assessment today to schedule patient's medication shipment from the [facility] [phone]).      Confirmed the medication and dosage are correct and have not changed: Yes, regimen is correct and unchanged.    Confirmed patient started or stopped the following medications in the past month:  No, there are no changes reported at this time.    Are you tolerating your medication?:  Anthonette reports tolerating the medication.    ADHERENCE        Did you miss any doses in the past 4 weeks? No missed doses reported.    FINANCIAL/SHIPPING    Delivery Scheduled: Yes, Expected medication delivery date: 04/02/2018     Medication will be delivered via Same Day Courier to the home address in [facility].    The patient will receive a drug information handout for each medication shipped and additional FDA Medication Guides as required.      Eilish did not have any additional questions at this time.    We will follow up with patient monthly for standard refill processing and delivery.      Thank you,  [name]   [facility] Specialty Technician

## 2018-03-28 ENCOUNTER — Other Ambulatory Visit: Payer: Self-pay

## 2018-03-28 ENCOUNTER — Ambulatory Visit
Admission: EM | Admit: 2018-03-28 | Discharge: 2018-03-28 | Disposition: A | Discharge status: Home / Self Care | Payer: Medicare HMO | Attending: Family Medicine | Admitting: Family Medicine

## 2018-03-28 ENCOUNTER — Ambulatory Visit (INDEPENDENT_AMBULATORY_CARE_PROVIDER_SITE_OTHER): Payer: Medicare HMO

## 2018-03-28 DIAGNOSIS — R05 Cough: Secondary | ICD-10-CM | POA: Diagnosis not present

## 2018-03-28 DIAGNOSIS — R0602 Shortness of breath: Secondary | ICD-10-CM

## 2018-03-28 DIAGNOSIS — R059 Cough, unspecified: Secondary | ICD-10-CM

## 2018-03-28 DIAGNOSIS — I318 Other specified diseases of pericardium: Secondary | ICD-10-CM | POA: Insufficient documentation

## 2018-03-28 DIAGNOSIS — R600 Localized edema: Secondary | ICD-10-CM | POA: Insufficient documentation

## 2018-03-28 LAB — COMPREHENSIVE METABOLIC PANEL
ALBUMIN: 3.9 g/dL (ref 3.5–5.0)
ALT: 22 U/L (ref 0–44)
ANION GAP: 8 (ref 5–15)
AST: 21 U/L (ref 15–41)
Alkaline Phosphatase: 113 U/L (ref 38–126)
BILIRUBIN TOTAL: 0.2 mg/dL — AB (ref 0.3–1.2)
BUN: 21 mg/dL — ABNORMAL HIGH (ref 6–20)
CO2: 20 mmol/L — ABNORMAL LOW (ref 22–32)
Calcium: 9.1 mg/dL (ref 8.9–10.3)
Chloride: 107 mmol/L (ref 98–111)
Creatinine, Ser: 1.42 mg/dL — ABNORMAL HIGH (ref 0.44–1.00)
GFR calc non Af Amer: 41 mL/min — ABNORMAL LOW (ref >60)
GFR, EST AFRICAN AMERICAN: 47 mL/min — AB (ref >60)
GLUCOSE: 75 mg/dL (ref 70–99)
POTASSIUM: 4.6 mmol/L (ref 3.5–5.1)
Sodium: 135 mmol/L (ref 135–145)
TOTAL PROTEIN: 7.7 g/dL (ref 6.5–8.1)

## 2018-03-28 LAB — CBC WITH DIFFERENTIAL/PLATELET
Abs Immature Granulocytes: 0.04 10*3/uL (ref 0.00–0.07)
BASOS ABS: 0.1 10*3/uL (ref 0.0–0.1)
Basophils Relative: 1 %
EOS ABS: 0.3 10*3/uL (ref 0.0–0.5)
EOS PCT: 3 %
HEMATOCRIT: 33 % — AB (ref 36.0–46.0)
Hemoglobin: 10.1 g/dL — ABNORMAL LOW (ref 12.0–15.0)
Immature Granulocytes: 0 %
LYMPHS ABS: 4.2 10*3/uL — AB (ref 0.7–4.0)
LYMPHS PCT: 38 %
MCH: 28.5 pg (ref 26.0–34.0)
MCHC: 30.6 g/dL (ref 30.0–36.0)
MCV: 93.2 fL (ref 80.0–100.0)
Monocytes Absolute: 0.5 10*3/uL (ref 0.1–1.0)
Monocytes Relative: 4 %
NEUTROS ABS: 6 10*3/uL (ref 1.7–7.7)
NEUTROS PCT: 54 %
NRBC: 0 % (ref 0.0–0.2)
Platelets: 268 10*3/uL (ref 150–400)
RBC: 3.54 MIL/uL — AB (ref 3.87–5.11)
RDW: 13.8 % (ref 11.5–15.5)
WBC: 11.1 10*3/uL — ABNORMAL HIGH (ref 4.0–10.5)

## 2018-03-28 LAB — BRAIN NATRIURETIC PEPTIDE: B NATRIURETIC PEPTIDE 5: 59 pg/mL (ref 0.0–100.0)

## 2018-03-28 MED ORDER — BENZONATATE 100 MG PO CAPS: 100.0000 mg | ORAL_CAPSULE | Freq: Three times a day (TID) | ORAL | 0 refills | Status: AC | PRN

## 2018-03-28 NOTE — ED Provider Notes (Signed)
[facility]    CSN: [id number] Arrival date & time: 03/28/18  1429  History   Chief Complaint Chief Complaint  Patient presents with  . Cough   HPI  58 year old female presents cough.  Patient reports a one-week history of cough.  Worse at night.  Denies PND or orthopnea.  She does note that over the past 3 days she has had lower extremity swelling.  She states that it extends to the thigh.  She notices shortness of breath with exertion.  No documented fever.  Cough is dry.  Symptoms seem to be worse at night.  No relieving factors.  No other associated symptoms.  No other complaints.  PMH, Surgical Hx, Family Hx, Social History reviewed and updated as below.  Past Medical History:  Diagnosis Date  . HIV (human immunodeficiency virus infection) (HCC)   . Hypertension   DM-2 HLD  Past Surgical History:  Procedure Laterality Date  . TOTAL ABDOMINAL HYSTERECTOMY     OB History   No obstetric history on file.    Home Medications    Prior to Admission medications   Medication Sig Start Date End Date Taking? Authorizing Provider  albuterol (PROVENTIL HFA;VENTOLIN HFA) 108 (90 Base) MCG/ACT inhaler Inhale 2 puffs into the lungs every 6 (six) hours as needed for wheezing or shortness of breath. 04/18/17  Yes Payton Mccallumonty, [place], MD  amitriptyline (ELAVIL) 100 MG tablet Take by mouth. 03/10/14 06/07/18 Yes [provider]  benazepril (LOTENSIN) 40 MG tablet  02/06/18  Yes [provider]  BIKTARVY 50-200-25 MG TABS tablet  03/05/18  Yes [provider]  glipiZIDE (GLUCOTROL) 10 MG tablet Take 10 mg by mouth daily before breakfast.   Yes [provider]  LANTUS SOLOSTAR 100 UNIT/ML Solostar Pen  02/06/18  Yes [provider]  metFORMIN (GLUCOPHAGE) 1000 MG tablet Take 1,000 mg by mouth 2 (two) times daily with a meal.   Yes [provider]  methadone (DOLOPHINE) 10 MG tablet  03/13/18  Yes [provider]  omeprazole  (PRILOSEC) 40 MG capsule Take by mouth. 10/02/14 06/07/18 Yes [provider]  pioglitazone (ACTOS) 15 MG tablet  03/22/18  Yes [provider]  pravastatin (PRAVACHOL) 20 MG tablet Take by mouth. 03/14/16 06/25/18 Yes [provider]  benzonatate (TESSALON) 100 MG capsule Take 1 capsule (100 mg total) by mouth 3 (three) times daily as needed. 03/28/18   Tommie Samsook, Tylin Force G, DO   Family History Family History  Adopted: Yes   Social History Social History   Tobacco Use  . Smoking status: Current Some Day Smoker    Types: Cigarettes  . Smokeless tobacco: Never Used  Substance Use Topics  . Alcohol use: No    Frequency: Never  . Drug use: No   Allergies   Tramadol and Vicodin [hydrocodone-acetaminophen]  Review of Systems Review of Systems  Constitutional: Negative for fever.  Respiratory: Positive for cough and shortness of breath.   Cardiovascular: Positive for leg swelling.   Physical Exam Triage Vital Signs ED Triage Vitals  Enc Vitals Group     BP 03/28/18 1519 106/77     Pulse Rate 03/28/18 1519 93     Resp 03/28/18 1519 18     Temp 03/28/18 1519 98.3 F (36.8 C)     Temp Source 03/28/18 1519 Oral     SpO2 03/28/18 1519 100 %     Weight 03/28/18 1516 195 lb (88.5 kg)     Height 03/28/18  1516 5' 1.5" (1.562 m)     Head Circumference --      Peak Flow --      Pain Score 03/28/18 1516 8     Pain Loc --      Pain Edu? --      Excl. in GC? --    Updated Vital Signs BP 106/77 (BP Location: Left Arm)   Pulse 93   Temp 98.3 F (36.8 C) (Oral)   Resp 18   Ht 5' 1.5" (1.562 m)   Wt 88.5 kg   SpO2 100%   BMI 36.25 kg/m   Visual Acuity Right Eye Distance:   Left Eye Distance:   Bilateral Distance:    Right Eye Near:   Left Eye Near:    Bilateral Near:     Physical Exam Vitals signs and nursing note reviewed.  Constitutional:      General: She is not in acute distress. HENT:     Head: Normocephalic and atraumatic.  Eyes:     General: No  scleral icterus.    Conjunctiva/sclera: Conjunctivae normal.  Cardiovascular:     Rate and Rhythm: Normal rate and regular rhythm.     Comments: 1-2 lower extremity pitting edema.  Pulmonary:     Effort: Pulmonary effort is normal.     Breath sounds: No wheezing, rhonchi or rales.  Neurological:     Mental Status: She is alert.  Psychiatric:        Mood and Affect: Mood normal.        Behavior: Behavior normal.    UC Treatments / Results  Labs (all labs ordered are listed, but only abnormal results are displayed) Labs Reviewed  CBC WITH DIFFERENTIAL/PLATELET - Abnormal; Notable for the following components:      Result Value   WBC 11.1 (*)    RBC 3.54 (*)    Hemoglobin 10.1 (*)    HCT 33.0 (*)    Lymphs Abs 4.2 (*)    All other components within normal limits  COMPREHENSIVE METABOLIC PANEL - Abnormal; Notable for the following components:   CO2 20 (*)    BUN 21 (*)    Creatinine, Ser 1.42 (*)    Total Bilirubin 0.2 (*)    GFR calc non Af Amer 41 (*)    GFR calc Af Amer 47 (*)    All other components within normal limits  BRAIN NATRIURETIC PEPTIDE    EKG None  Radiology Dg Chest 2 View  Result Date: 03/28/2018 CLINICAL DATA:  Shortness of breath with dry cough for 2 weeks. Leg swelling for 3 days. History of hypertension and diabetes. EXAM: CHEST - 2 VIEW COMPARISON:  None. FINDINGS: The heart size and mediastinal contours are normal. There are dense calcifications along the anterior aspect of the heart on the lateral view, most consistent with calcific pericarditis. The lungs are clear. There is no pleural effusion or pneumothorax. No acute osseous findings are evident. 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 No acute findings identified. Electronically Signed   By: Carey BullocksWilliam  Veazey M.D.   On: 03/28/2018 16:17    Procedures Procedures (including critical care  time)  Medications Ordered in UC Medications - No data to display  Initial Impression / Assessment and Plan / UC Course  I have reviewed the triage vital signs and the nursing notes.  Pertinent labs & imaging results that were available during my care of the patient were reviewed by me  and considered in my medical decision making (see chart for details).     58 year old female presents with cough and SOB.  The etiology of her symptoms is unclear.  X-ray revealed calcific pericarditis concerning for constrictive pericarditis.  Laboratory studies revealed a creatinine of 1.4.  Her recent labs in October revealed a creatinine of 1.59.  Mild anemia of 10.1.  BNP was normal.  Will arrange for patient to have echocardiogram.  May need to see cardiology.  Tessalon Perles for cough.  Final Clinical Impressions(s) / UC Diagnoses   Final diagnoses:  Cough  SOB (shortness of breath)  Lower extremity edema  Calcification, pericardium     Discharge Instructions     Cough medication as prescribed.  I will call with the remainder of the results. We will call tomorrow regarding your echo.  Take care  Dr. Adriana Simas    ED Prescriptions    Medication Sig Dispense Auth. Provider   benzonatate (TESSALON) 100 MG capsule Take 1 capsule (100 mg total) by mouth 3 (three) times daily as needed. 30 capsule Tommie Sams, DO     Controlled Substance Prescriptions [place] Controlled Substance Registry consulted? Not Applicable   Tommie Sams, DO 03/28/18 2033

## 2018-03-28 NOTE — ED Triage Notes (Signed)
Patient complains of cough, shortness of breath and leg swelling x 1 week. Patient states that she has been unable to sleep due to cough.

## 2018-03-28 NOTE — Discharge Instructions (Signed)
Cough medication as prescribed.  I will call with the remainder of the results. We will call tomorrow regarding your echo.  Take care  Dr. Adriana Simas

## 2018-04-02 MED FILL — BIKTARVY 50 MG-200 MG-25 MG TABLET: 30 days supply | Qty: 30 | Fill #3 | Status: AC

## 2018-04-02 MED FILL — BIKTARVY 50 MG-200 MG-25 MG TABLET: ORAL | 30 days supply | Qty: 30 | Fill #3

## 2018-04-02 MED FILL — PRAVASTATIN 20 MG TABLET: ORAL | 90 days supply | Qty: 90 | Fill #1

## 2018-04-02 MED FILL — PRAVASTATIN 20 MG TABLET: 90 days supply | Qty: 90 | Fill #1 | Status: AC

## 2018-04-04 ENCOUNTER — Telehealth (HOSPITAL_COMMUNITY): Payer: Self-pay | Admitting: Emergency Medicine

## 2018-04-04 MED ORDER — METHADONE 10 MG TABLET
ORAL_TABLET | Freq: Four times a day (QID) | ORAL | 0 refills | 0.00000 days | Status: CP
Start: 2018-04-04 — End: 2018-04-05

## 2018-04-04 NOTE — Telephone Encounter (Signed)
Patient contacted and made aware of all results, all questions answered. Will follow up with PCP or Cardiology as needed.

## 2018-04-06 MED ORDER — METHADONE 10 MG TABLET
ORAL_TABLET | Freq: Four times a day (QID) | ORAL | 0 refills | 0 days | Status: CP
Start: 2018-04-06 — End: 2018-05-02

## 2018-04-11 ENCOUNTER — Ambulatory Visit
Admit: 2018-04-11 | Discharge: 2018-04-13 | Disposition: A | Discharge status: Home / Self Care | Payer: MEDICARE | Admitting: Hospitalist

## 2018-04-11 DIAGNOSIS — R6510 Systemic inflammatory response syndrome (SIRS) of non-infectious origin without acute organ dysfunction: Principal | ICD-10-CM

## 2018-04-11 LAB — COMPREHENSIVE METABOLIC PANEL
ALBUMIN: 3.6 g/dL (ref 3.5–5.0)
ALKALINE PHOSPHATASE: 120 U/L (ref 38–126)
ALT (SGPT): 24 U/L (ref <35)
ANION GAP: 15 mmol/L (ref 7–15)
AST (SGOT): 28 U/L (ref 14–38)
BILIRUBIN TOTAL: 1 mg/dL (ref 0.0–1.2)
BLOOD UREA NITROGEN: 27 mg/dL — ABNORMAL HIGH (ref 7–21)
BUN / CREAT RATIO: 15
CALCIUM: 9.3 mg/dL (ref 8.5–10.2)
CHLORIDE: 107 mmol/L (ref 98–107)
CO2: 16 mmol/L — ABNORMAL LOW (ref 22.0–30.0)
CREATININE: 1.77 mg/dL — ABNORMAL HIGH (ref 0.60–1.00)
EGFR CKD-EPI AA FEMALE: 36 mL/min/{1.73_m2} — ABNORMAL LOW (ref >=60)
EGFR CKD-EPI NON-AA FEMALE: 31 mL/min/{1.73_m2} — ABNORMAL LOW (ref >=60)
POTASSIUM: 4 mmol/L (ref 3.5–5.0)
PROTEIN TOTAL: 7.1 g/dL (ref 6.5–8.3)
SODIUM: 138 mmol/L (ref 135–145)

## 2018-04-11 LAB — CBC W/ AUTO DIFF
BASOPHILS ABSOLUTE COUNT: 0 10*9/L (ref 0.0–0.1)
BASOPHILS ABSOLUTE COUNT: 0 10*9/L (ref 0.0–0.1)
BASOPHILS RELATIVE PERCENT: 0.1 %
BASOPHILS RELATIVE PERCENT: 0.1 %
EOSINOPHILS ABSOLUTE COUNT: 0.1 10*9/L (ref 0.0–0.4)
EOSINOPHILS ABSOLUTE COUNT: 0 10*9/L (ref 0.0–0.4)
EOSINOPHILS RELATIVE PERCENT: 0.6 %
HEMATOCRIT: 31.8 % — ABNORMAL LOW (ref 36.0–46.0)
HEMOGLOBIN: 9.8 g/dL — ABNORMAL LOW (ref 13.5–16.0)
HEMOGLOBIN: 10.1 g/dL — ABNORMAL LOW (ref 13.5–16.0)
LARGE UNSTAINED CELLS: 2 % (ref 0–4)
LYMPHOCYTES ABSOLUTE COUNT: 1.2 10*9/L — ABNORMAL LOW (ref 1.5–5.0)
LYMPHOCYTES ABSOLUTE COUNT: 0.9 10*9/L — ABNORMAL LOW (ref 1.5–5.0)
LYMPHOCYTES RELATIVE PERCENT: 5.7 %
LYMPHOCYTES RELATIVE PERCENT: 3.9 %
MEAN CORPUSCULAR HEMOGLOBIN CONC: 32.1 g/dL (ref 31.0–37.0)
MEAN CORPUSCULAR HEMOGLOBIN CONC: 31.8 g/dL (ref 31.0–37.0)
MEAN CORPUSCULAR HEMOGLOBIN: 28.8 pg (ref 26.0–34.0)
MEAN CORPUSCULAR HEMOGLOBIN: 29 pg (ref 26.0–34.0)
MEAN CORPUSCULAR VOLUME: 89.9 fL (ref 80.0–100.0)
MEAN CORPUSCULAR VOLUME: 91.2 fL (ref 80.0–100.0)
MEAN PLATELET VOLUME: 7.2 fL (ref 7.0–10.0)
MONOCYTES ABSOLUTE COUNT: 1 10*9/L — ABNORMAL HIGH (ref 0.2–0.8)
MONOCYTES ABSOLUTE COUNT: 0.8 10*9/L (ref 0.2–0.8)
MONOCYTES RELATIVE PERCENT: 4.7 %
MONOCYTES RELATIVE PERCENT: 3.7 %
NEUTROPHILS ABSOLUTE COUNT: 18.4 10*9/L — ABNORMAL HIGH (ref 2.0–7.5)
NEUTROPHILS ABSOLUTE COUNT: 20.3 10*9/L — ABNORMAL HIGH (ref 2.0–7.5)
NEUTROPHILS RELATIVE PERCENT: 87.1 %
NEUTROPHILS RELATIVE PERCENT: 91 %
PLATELET COUNT: 255 10*9/L (ref 150–440)
PLATELET COUNT: 239 10*9/L (ref 150–440)
RED BLOOD CELL COUNT: 3.42 10*12/L — ABNORMAL LOW (ref 4.00–5.20)
RED BLOOD CELL COUNT: 3.49 10*12/L — ABNORMAL LOW (ref 4.00–5.20)
RED CELL DISTRIBUTION WIDTH: 14.2 % (ref 12.0–15.0)
WBC ADJUSTED: 21.1 10*9/L — ABNORMAL HIGH (ref 4.5–11.0)
WBC ADJUSTED: 22.3 10*9/L — ABNORMAL HIGH (ref 4.5–11.0)

## 2018-04-11 LAB — C-REACTIVE PROTEIN: C reactive protein:MCnc:Pt:Ser/Plas:Qn:: 328.1 — ABNORMAL HIGH

## 2018-04-11 LAB — MEAN CORPUSCULAR HEMOGLOBIN: Lab: 28.8

## 2018-04-11 LAB — ERYTHROCYTE SEDIMENTATION RATE: Lab: 108 — ABNORMAL HIGH

## 2018-04-11 LAB — LYMPH MARKER LIMITED,FLOW
ABSOLUTE CD3 CNT: 1087 {cells}/uL (ref 915–3400)
ABSOLUTE CD8 CNT: 633 {cells}/uL (ref 180–1520)
CD3% (T CELLS)": 67 % (ref 61–86)
CD4% (T HELPER)": 27 % — ABNORMAL LOW (ref 34–58)
CD4:CD8 RATIO: 0.7 — ABNORMAL LOW (ref 0.9–4.8)
CD8% T SUPPRESR": 39 % — ABNORMAL HIGH (ref 12–38)

## 2018-04-11 LAB — BASIC METABOLIC PANEL
ANION GAP: 9 mmol/L (ref 7–15)
BLOOD UREA NITROGEN: 24 mg/dL — ABNORMAL HIGH (ref 7–21)
BUN / CREAT RATIO: 17
CALCIUM: 8.9 mg/dL (ref 8.5–10.2)
CHLORIDE: 108 mmol/L — ABNORMAL HIGH (ref 98–107)
EGFR CKD-EPI AA FEMALE: 46 mL/min/{1.73_m2} — ABNORMAL LOW (ref >=60)
EGFR CKD-EPI NON-AA FEMALE: 40 mL/min/{1.73_m2} — ABNORMAL LOW (ref >=60)
GLUCOSE RANDOM: 209 mg/dL — ABNORMAL HIGH (ref 65–179)
POTASSIUM: 4.2 mmol/L (ref 3.5–5.0)

## 2018-04-11 LAB — CREATININE: Creatinine:MCnc:Pt:Ser/Plas:Qn:: 1.43 — ABNORMAL HIGH

## 2018-04-11 LAB — PRO-BNP: Natriuretic peptide.B prohormone N-Terminal:MCnc:Pt:Ser/Plas:Qn:: 818 — ABNORMAL HIGH

## 2018-04-11 LAB — ABSOLUTE CD8 CNT: Lab: 633

## 2018-04-11 LAB — CALCIUM: Calcium:MCnc:Pt:Ser/Plas:Qn:: 9.3

## 2018-04-11 LAB — LARGE UNSTAINED CELLS: Lab: 1

## 2018-04-11 LAB — URINALYSIS WITH CULTURE REFLEX
BILIRUBIN UA: NEGATIVE
KETONES UA: NEGATIVE
NITRITE UA: POSITIVE — AB
PH UA: 5.5 (ref 5.0–9.0)
RBC UA: 3 /HPF (ref <4)
SPECIFIC GRAVITY UA: 1.007 (ref 1.005–1.040)
SQUAMOUS EPITHELIAL: 1 /HPF (ref 0–5)
UROBILINOGEN UA: 0.2
WBC UA: >100 /HPF — ABNORMAL HIGH (ref 0–5)

## 2018-04-11 LAB — TROPONIN I
TROPONIN I: <0.06 ng/mL (ref <0.060)
Troponin I.cardiac:MCnc:Pt:Ser/Plas:Qn:: <0.06
Troponin I.cardiac:MCnc:Pt:Ser/Plas:Qn:: <0.06

## 2018-04-11 LAB — CREATINE KINASE TOTAL: Creatine kinase:CCnc:Pt:Ser/Plas:Qn:: 138

## 2018-04-11 LAB — PHOSPHORUS: Phosphate:MCnc:Pt:Ser/Plas:Qn:: 2.1 — ABNORMAL LOW

## 2018-04-11 LAB — LACTATE BLOOD VENOUS: Lactate:SCnc:Pt:BldV:Qn:: 1.3

## 2018-04-11 LAB — LEUKOCYTE ESTERASE UA

## 2018-04-11 NOTE — Unmapped (Signed)
General Medicine History and Physical    Assessment/Plan:    Principal Problem:    Sepsis (CMS-HCC)  Active Problems:    SOB (shortness of breath)    Elevated serum creatinine    HIV (human immunodeficiency virus infection) (CMS-HCC)    Abdominal discomfort    Dizziness on standing    Diabetes (CMS-HCC)    Hypertension    Chronic pain      Anna Cantrell is a 58 y.o. female with HIV with undetectable viral load in recent years, IDDM, HTN, and chronic pain on methadone, who presented to [facility] with fever, SOB, dry cough, nausea and abdominal discomfort. Chart is marked for merge with another: MRN [id number]    Sepsis with unclear source, possible pericarditis  3/4 SIRS criteria (WBC 21, T 39.4, HR 130 while febrile but much improved after she got fluids and defervesced) on presentation. Lactate 1.3. Initial evaluation notable for bicarb 16 (AG 15), Cr 1.8 (1.6 last check in October but previously normal), normocytic anemia (hgb 9.8 from baseline 11-12), ESR 108, CRP 328, negative flu, negative troponin, ECG with slight lateral ST depressions and TWI, CXR with no infiltrates or pulmonary edema but pericardial calcifications noted. Pericarditis could explain sepsis picture, shortness of breath, nonspecific ECG changes, and pericardial calcifications, however: ECG changes are not classic, she does not have chest pain, and would not anticipate calcifications in an acute pericarditis. Subacute (or chronic) pericarditis is possible. She denies personal or family history of autoimmune disorders. DDx includes viral process given dry cough, malaise and other vague complaints. Denies other localizing symptoms including diarrhea or dysuria. See below re: HIV status and risk of opportunistic infections.   -IV vancomycin and cefepime  -Follow-up blood cultures  -Send respiratory viral panel  -U/a pending (though no clear urinary symptoms)  -TB quantiferon given pericardial calcification and hx of HIV   -Obtain echo in the morning, likely consult cardiology pending results  -For now, defer treating possible pericarditis (given renal insufficiency and concern for systemic infection are relative contraindications for NSAIDs and glucocorticoids respectively)    SOB, abdominal distension  See above re: possible pericarditis. Differential includes CHF. Would want to rule out constrictive pericarditis with right sided failure given her complaints and pericardial calcifications on xray. No orthopnea, pulmonary edema or LE edema but she does note recent swelling and possible weight gain.  - f/u echo and BNP  - weigh standing on admission    HIV  Viral load has been undetectable for years. CD4 >900 at last visit with Dr. Carita Pian 12/13/17. She did change medication regimens to [facility] in late 2019, reports good adherence.   - Continue Biktarvy  - Check viral load and CD4 count    AKI vs. CKD  Creatinine 1.8. Most likely chronic given last creatinine in October was 1.6.    - Hold benazepril in case there is an acute component  - Trend Cr    Normocytic anemia  Denies blood loss. CKD or inflammatory process may be contributing.  - Trend to ensure stability    Chronic pain- continue home methadone 10 mg 4 times daily, Amtriptyline 100 qhs  DM: NPH 20u now, then resume lantus 40u nightly.  Sliding scale insulin.  Hold oral hypoglycemics.  Hypercholesterolemia:  Continue statin  HTN: Hold Clonidine 0.2 bid  Smoking: Declines nicotine patch    FEN  -Bolus PRN  -Replete PRN   -Regular diet    Prophy: ambulatory    FULL CODE    Dispo:  Admit, inpatient status    ___________________________________________________________________    HPI:  Anna Cantrell is a 58 y.o. female with past medical history as above and as reviewed in the EMR, who presented to [facility] with multiple complaints.    Anna Cantrell follows with Dr. Carita Pian at [facility] - she has 2 charts which are being merged.  We last saw him in October.  She has had an undetectable viral load and no issues with her HIV meds or complications from HIV in years.  She was in her usual state of health when she turned from travel to [facility] 1/31.  On 2/1, she started developing multiple symptoms including lightheadedness with standing, fatigue, dry cough, shortness of breath both at rest and with exertion, diffuse abdominal discomfort and distention, and nausea.  Describes abdominal pain as feeling like she needs to have a bowel movement, but has continued to have normal daily bowel movements without blood or diarrhea, not get relief with stooling.  She has not had vomiting.  She has had daily subjective fevers.  She believes she has gained weight with ' fluid retention' and typically weighs 190 pounds but does not know her current weight.  She has not had any chest pain and does not have pain with taking a deep breath.  She is not short of breath when lying flat.  She noted lower extremity edema a couple weeks ago which was unusual for her but has since resolved.  She has been having urinary incontinence with coughing but denies dysuria other changes in urination.     In the emergency department, pt was violent tachycardic as above. Lab evaluation and imaging was reviewed in epic. Was treated with 2.7 L LR, Tylenol, cefepime, vancomycin.     Allergies:  Patient has no known allergies.    Medications:   Prior to Admission medications    Medication Dose, Route, Frequency   acyclovir (ZOVIRAX) 400 MG tablet 400 mg, Oral, Every 8 hours, As needed for outbreaks   amitriptyline (ELAVIL) 100 MG tablet 100 mg, Oral, Nightly   aspirin (ECOTRIN) 81 MG tablet 81 mg, Oral, Daily (standard)   benazepriL (LOTENSIN) 40 MG tablet 40 mg, Oral, Daily (standard)   cloNIDine HCl (CATAPRES) 0.2 MG tablet 0.2 mg, Oral, 2 times a day (standard)   glipiZIDE (GLUCOTROL) 10 MG tablet 10 mg, Oral, 2 times a day (AC)   insulin glargine (LANTUS) 100 unit/mL injection 40 Units, Subcutaneous, Nightly   metFORMIN (GLUCOPHAGE) 1000 MG tablet 1,000 mg, Oral, 2 times a day with meals   methadone (DOLOPHINE) 10 MG tablet 10 mg, Oral, 4 times a day   omeprazole (PRILOSEC) 40 MG capsule 40 mg, Oral, Daily (standard)   pioglitazone (ACTOS) 15 MG tablet 15 mg, Oral, Daily (standard)   pravastatin (PRAVACHOL) 20 MG tablet 20 mg, Oral, Daily (standard)       Medical History:  Past Medical History:   Diagnosis Date   ??? Chronic pain    ??? Diabetes mellitus (CMS-HCC)    ??? HIV (human immunodeficiency virus infection) (CMS-HCC)     undetectable viral load for years   ??? Hypertension        Surgical History:  Past Surgical History:   Procedure Laterality Date   ??? HYSTERECTOMY         Social History:  Social History     Socioeconomic History   ??? Marital status: Single     Spouse name: Not on file   ??? Number of  children: Not on file   ??? Years of education: Not on file   ??? Highest education level: Not on file   Occupational History   ??? Not on file   Social Needs   ??? Financial resource strain: Not on file   ??? Food insecurity     Worry: Not on file     Inability: Not on file   ??? Transportation needs     Medical: Not on file     Non-medical: Not on file   Tobacco Use   ??? Smoking status: Current Some Day Smoker     Types: Cigarettes   ??? Smokeless tobacco: Never Used   Substance and Sexual Activity   ??? Alcohol use: Not Currently   ??? Drug use: Not Currently   ??? Sexual activity: Not on file   Lifestyle   ??? Physical activity     Days per week: Not on file     Minutes per session: Not on file   ??? Stress: Not on file   Relationships   ??? Social Wellsite geologist on phone: Not on file     Gets together: Not on file     Attends religious service: Not on file     Active member of club or organization: Not on file     Attends meetings of clubs or organizations: Not on file     Relationship status: Not on file   Other Topics Concern   ??? Not on file   Social History Narrative   ??? Not on file       Family History:  Family History   Adopted: Yes   Family history unknown: Yes       Review of Systems:  10 systems reviewed and are negative unless otherwise mentioned in HPI    Labs/Studies:  Labs and Studies from the last 24hrs per EMR and Reviewed    Physical Exam:  Temp:  [36.6 ??C (97.8 ??F)-39.4 ??C (103 ??F)] 36.6 ??C (97.8 ??F)  Heart Rate:  [96] 96  SpO2 Pulse:  [130] 130  Resp:  [16-20] 16  BP: (127-131)/(67-78) 131/67  SpO2:  [98 %-100 %] 100 %    GEN: Lying in bed, fatigued appearing   EYES: Anicteric, EOMI  ENT: MMM, clear oropharynx  CV: Tachycardic, regular, no murmurs, friction rub, or extra heart sounds appreciated; no definite JVD though exam is challenging due to habitus  PULM: CTA B, comfortable work of breathing  ABD: Soft, mildly distended, nontender, BS present  EXT: No edema  SKIN: No rashes or lesions on examined skin  NEURO: CN II-XII grossly intact, no focal deficits appreciated  PSYCH: Alert and oriented to history and exam, Appropriate

## 2018-04-11 NOTE — Unmapped (Signed)
[facility] Emergency Department Provider Note    ED Clinical Impression     Final diagnoses:   SIRS (systemic inflammatory response syndrome) (CMS-HCC) (Primary)       Initial Impression, ED Course, Assessment and Plan     Anna Cantrell is a 58 y.o. female with a PMH of DM, HTN, and HIV dx in 1990 who presents for 2-3 days of dizziness, shortness of breath upon rest, mildly productive cough, constant abdominal pain, nausea, and BLE pain.    On exam, the patient is ill but non-toxic appearing and in NAD. Her vitals are significant for HR of 130, mild tachypnea, and temperature of 103F. Other vitals are WNL. She has tonsillar hypertrophy. No exudates. Uvula midline. TMs retracted without effusions bilaterally. Heart rhythm regular, lungs CTAB, and normal work of breathing. She has RLQ abdominal TTP with guarding but no rebound. Abdomen otherwise soft and nondistended. No CVA TTP. No rashes.     Concern for sepsis 2/2 pericarditis vs influenza vs PNA. Plan for EKG, CXR, and labs including blood cultures, troponin, total CK, UA, lactate, lymphocyte markers, and HIV RNA. Will give tylenol and reassess.    2:15 AM  Delay in abx administration as no IV pumps available. RN working to obtain pump.    2:46 AM  MAO paged for admission. Pt updated with plan.    Repeat Volume Status and Tissue Perfusion Assessment    I personally examined this patient at 2:47 AM and attest that I have completed a focused tissue perfusion assessment.    BMI:  (!) 36.86 kg/m??   Height:  154.9 cm (5' 1)   Weight:  88.5 kg (195 lb)  IBW:  47.8 kg  IV Fluid Bolus:  BMI is >30, so fluid resuscitation ordered based on ideal body weight due to obesity   Mental Status:  Alert, oriented, thought content appropriate  Cardiopulmonary Exam:   ?? Cardiac:  Tachycardia, no m/g/r, peripheral pulses intact  ?? Pulmonary:  CTAB    Capillary Refill:  < or = 2 seconds  Peripheral Pulse Evaluation:  radial R:2+ (normal)/L:2+ (normal)  Skin Examination: No signs of Cyanosis    Vital Signs At Time Of Assessment:  VS reviewed by me.      I independently visualized the EKG tracing.   I independently visualized the radiology images.   I reviewed the patient's prior medical records.   I discussed the case with the admitting provider.   I obtained additional history from her husband.       ____________________________________________    Time seen: April 11, 2018 12:41 AM       History     Chief Complaint  Dizziness      HPI   Anna Cantrell is a 58 y.o. female with a PMH of DM, HTN, and HIV dx in 1990 who presents for dizziness. The patient states that since 2/02, she has had tactile fever, dizziness, shortness of breath upon rest, mildly productive cough, constant abdominal pain, nausea, and BLE pain. Abdominal pain is not relieved by having a BM. She is compliant with HIV medication, metformin, lantus, and BP med. She mentions that her most recent viral load was undetectable and CD4 count was >1000. She notes that she recently returned from [facility] via airplane but denies known sick contacts. She denies vomiting, diarrhea, constipation, dysuria, hematuria, or rashes. She did get a flu vaccination this year.       Past Medical History:   Diagnosis  Date   ??? Diabetes mellitus (CMS-HCC)    ??? HIV (human immunodeficiency virus infection) (CMS-HCC)    ??? Hypertension        No past surgical history on file.    No current facility-administered medications for this encounter.   No current outpatient medications on file.    Allergies  Patient has no known allergies.    History reviewed. No pertinent family history.    Social History  Social History     Tobacco Use   ??? Smoking status: Current Some Day Smoker     Types: Cigarettes   ??? Smokeless tobacco: Never Used   Substance Use Topics   ??? Alcohol use: Not Currently   ??? Drug use: Not Currently       Review of Systems  Constitutional: Positive for fever.  Eyes: Negative for visual changes.  ENT: Negative for sore throat. Cardiovascular: Negative for chest pain.   Respiratory: Positive for cough and shortness of breath.  Gastrointestinal: Positive for abdominal pain and nausea. Negative for vomiting, diarrhea, or constipation.   Genitourinary: Negative for dysuria.  Musculoskeletal: Positive for BLE pain. Negative for back pain.  Skin: Negative for rash.  Neurological: Positive for dizziness. Negative for headaches, weakness or numbness.      Physical Exam     VITAL SIGNS:    ED Triage Vitals [04/10/18 2324]   Enc Vitals Group      BP 127/78      Pulse       SpO2 Pulse 130      Resp 20      Temp 39.4 ??C (103 ??F)      Temp Source Oral      SpO2 98 %      Weight 88.5 kg (195 lb)      Height 1.549 m (5' 1)     Constitutional: Alert and oriented. Ill but non-toxic appearing and in no distress.  Eyes: Conjunctivae are normal.  ENT       Head: Normocephalic and atraumatic.       Nose: No congestion.       Mouth/Throat: Mucous membranes are moist. Tonsillar hypertrophy. No exudates. Uvula midline.       Ears: TMs retracted without effusions bilaterally.       Neck: No stridor, neck is supple.  Hematological/Lymphatic/Immunilogical: No cervical lymphadenopathy.  Cardiovascular: Tachycardia. Regular rhythm. No m/g/r. Normal and symmetric distal pulses are present in all extremities.   Respiratory: Tachypnea. Breath sounds are normal. No wheezes, rales, rhonchi.  Gastrointestinal: She has RLQ abdominal TTP with guarding but no rebound. Abdomen otherwise soft and nondistended. No CVA TTP.  Musculoskeletal: Nontender with normal range of motion in all extremities.       Right lower leg: No tenderness or edema.       Left lower leg: No tenderness or edema.  Neurologic: Normal speech and language. No gross focal neurologic deficits are appreciated.   Skin: Skin is warm, dry and intact. No rash noted.  Psychiatric: Mood and affect are normal. Speech and behavior are normal.    EKG      Adult ECG Report, 2328     Name: Cathline Dowen   Age: 58 y.o.   Gender: female       Rate: 131   Rhythm: sinus tachycardia   Narrative Interpretation: st depressions in V5, V6     Adult ECG Report, 0024     Name: Lisett Dirusso   Age: 58 y.o.  Gender: female       Rate: 127   Rhythm: sinus tachycardia   Narrative Interpretation: st depressions still present        Radiology     XR Chest 2 views   Preliminary Result      Pericardial calcifications, which may be seen in setting of pericarditis. Recommend correlation with clinical history.      No pulmonary consolidations.        Critical Care  Performed by: Jamas Lav, MD  Authorized by: Jamas Lav, MD     Critical care provider statement:     Critical care time (minutes):  45    Critical care time was exclusive of:  Separately billable procedures and treating other patients and teaching time    Critical care was necessary to treat or prevent imminent or life-threatening deterioration of the following conditions:  Sepsis    Critical care was time spent personally by me on the following activities:  Development of treatment plan with patient or surrogate, discussions with consultants, evaluation of patient's response to treatment, examination of patient, obtaining history from patient or surrogate, review of old charts, re-evaluation of patient's condition, pulse oximetry, ordering and review of radiographic studies, ordering and review of laboratory studies and ordering and performing treatments and interventions            Pertinent labs & imaging results that were available during my care of the patient were reviewed by me and considered in my medical decision making (see chart for details).    Documentation assistance was provided by Merwyn Katos, Scribe on April 11, 2018 at 12:41 AM for Idalia Needle, MD.  April 11, 2018 2:11 AM. Documentation assistance provided by the scribe. I was present during the time the encounter was recorded. The information recorded by the scribe was done at my direction and has been reviewed and validated by me.        Jamas Lav, MD  04/11/18 [phone]

## 2018-04-11 NOTE — Unmapped (Signed)
Medicine Daily Progress Note    Assessment/Plan:  Principal Problem:    Sepsis (CMS-HCC)  Active Problems:    SOB (shortness of breath)    Elevated serum creatinine    HIV (human immunodeficiency virus infection) (CMS-HCC)    Abdominal discomfort    Dizziness on standing    Diabetes (CMS-HCC)    Hypertension    Chronic pain  Resolved Problems:    * No resolved hospital problems. *           Anna Cantrell is a 59 y.o. female with well-controlled HIV, diabetes, chronic pain presenting with a week of fevers, cough, leukocytosis, abdominal pain and bloating found to have possible sepsis and pericardial calcifications on chest x-ray.    Fevers,Cough, Leukocytosis, abdominal pain, now with GNR bacteremia-Influenza and respiratory pathogen panel negative.  Leukocytosis has not responded to broad-spectrum antibiotics  Patient continues to fever.  UA with pyuria and small blood although the patient denies significant urinary symptoms.  This is the most likely source of the GNR or bacteremia.  Could also consider a pulmonary source.  Will the patient has HIV, viral loads have had good control which makes an opportunistic infection less likely.   I Had initial concern for coccidiomycosis, but patient says that symptoms preceded travel to [facility].  -F/u blood and urine cultures  -CTN cefepime while cultures pending, stop vancomycin   -If fevers and leukocytosis do not improve on antibiotics with a sensitive blood culture, consider lack of source control.  May need abdominal imaging not improving by 2/6.      C/f Pericardial Calcifications -the patient denies chest pain.   -TTE performed did not show signs of constrictive pericarditis or pericardial calcifications, it is unclear what caused the imaging on chest x-ray, consider further imaging of the chest. Appreciate cardiology's recs.   -Follow-up quant gold    AKI -creatinine improved from 1.77-1.43 with fluids.  Baseline appears to be about 1.6 per chart review. HIV-Viral load has been undetectable for years. CD4 >900 at last visit with Dr. Carita Pian 12/13/17.  CD4 count is greater than 400.  -Substitute dolutegravir, descovy for home Biktarvy  -Viral load pending     Chronic Medical Problems:   Chronic pain- continue home methadone 10 mg 4 times daily, Amtriptyline 100 qhs  DM: Lantus 40u nightly.  Sliding scale insulin.  Hold oral hypoglycemics.  Hypercholesterolemia:?? Continue statin  HTN:??Hold Clonidine 0.2 bid  Smoking: Declines nicotine patch  ___________________________________________________________________    Subjective:  Patient is having significant nausea at this morning and was seen dry heaving upon entrance to the exam room.  She still continues to have a cough and feel poorly    Labs/Studies:  Labs and Studies from the last 24hrs per EMR and Reviewed      Objective:  Temp:  [35.9 ??C-39.4 ??C] 38.2 ??C  Heart Rate:  [70-96] 70  SpO2 Pulse:  [130] 130  Resp:  [16-20] 16  BP: (115-131)/(66-79) 125/79  SpO2:  [98 %-100 %] 98 %    GEN: Lying in bed, appears uncomfortable but in NAD   EYES: EOMI, anicteric sclera  ENT: MMM, no mucosal lesions seen  CV: RRR, no murmurs heard  PULM: CTA B, and cough, normal work of breathing on room air  ABD: soft, NT/ND, +BS, mild tenderness to palpation on the left lateral flank area  EXT: No edema, warm extremities

## 2018-04-11 NOTE — Unmapped (Signed)
Care Management  Initial Transition Planning Assessment     58 y.o. female with HIV with undetectable viral load in recent years, IDDM, HTN, and chronic pain on methadone, who presented to [facility] with fever, SOB, dry cough, nausea and abdominal discomfort. C            General  Care Manager assessed the patient by : Medical record review(telephone interview with pt)  Orientation Level: Oriented X4  Who provides care at home?: N/A  Reason for referral: Discharge Planning    Contact/Decision Maker  Bernard spouse     Legal Next of Kin / Guardian / POA / Advance Directives       Advance Directive (Medical Treatment)  Does patient have an advance directive covering medical treatment?: Patient would not like information., Patient does not have advance directive covering medical treatment.  Reason patient does not have an advance directive covering medical treatment:: Patient does not wish to complete one at this time  Reason there is not a Health Care Decision Maker appointed:: Patient does not wish to appoint a Health Care Decision Maker at this time  Information provided on advance directive:: No  Patient requests assistance:: No    Advance Directive (Mental Health Treatment)  Does patient have an advance directive covering mental health treatment?: Patient would not like information., Patient does not have advance directive covering mental health treatment.  Reason patient does not have an advance directive covering mental health treatment:: Patient does not wish to complete one at this time.    Patient Information  Lives with: Spouse/significant other    Type of Residence: Private residence        Location/Detail: [address] [place] [address] this is a third floor apt, she denies any difficulty navigating steps/stairs I just take my time    Support Systems: Spouse    Responsibilities/Dependents at home?: No    Home Care services in place prior to admission?: No       Equipment Currently Used at Home: grab bar, tub/shower, glucometer       Currently receiving outpatient dialysis?: No       Financial Information       Need for financial assistance?: No       Social Determinants of Health  Social Determinants of Health were addressed in provider documentation.  Please refer to patient history.    Discharge Needs Assessment  Concerns to be Addressed: denies needs/concerns at this time, no discharge needs identified    Clinical Risk Factors: Multiple Diagnoses (Chronic), Poor Health Literacy    Barriers to taking medications: No    Prior overnight hospital stay or ED visit in last 90 days: No    Readmission Within the Last 30 Days: no previous admission in last 30 days         Anticipated Changes Related to Illness: none    Equipment Needed After Discharge: none    Discharge Facility/Level of Care Needs: (home likely self care)    Readmission  Risk of Unplanned Readmission Score: UNPLANNED READMISSION SCORE: 14%  Predictive Model Details           14% ([facility]) Factors Contributing to Score   Calculated 04/11/2018 09:16 26% Number of active Rx orders is 29   [place] Risk of Unplanned Readmission Model 12% ECG/EKG order is present in last 6 months     10% Latest BUN is high (27 mg/dL)     9% Imaging order is present in last 6 months  8% Latest hemoglobin is low (9.8 g/dL)     8% Phosphorous result is present     8% Charlson Comorbidity Index is 5     7% Number of ED visits in last six months is 1     Readmitted Within the Last 30 Days? (No if blank)   Patient at risk for readmission?: Yes    Discharge Plan  Screen findings are: Care Manager reviewed the plan of the patient's care with the Multidisciplinary Team. No discharge planning needs identified at this time. Care Manager will continue to manage plan and monitor patient's progress with the team.    Expected Discharge Date: (TBD)    Expected Transfer from Critical Care: (n/a)    Patient and/or family were provided with choice of facilities / services that are available and appropriate to meet post hospital care needs?: Yes   List choices in order highest to lowest preferred, if applicable. : no preference of agency if needed    Initial Assessment complete?: Yes  Adline Potter  April 11, 2018 10:13 AM

## 2018-04-11 NOTE — Unmapped (Signed)
Pt received to the floor from the ED for dizziness at this time.Pt oriented to room.Questions answered appropriately.Has been introduced HCT.Droplet Isolation explained to patient and family,verbalized understanding.Fall precaution initiated.Encouraged to call for help when in need.Will keep monitoring.  Problem: Adult Inpatient Plan of Care  Goal: Plan of Care Review  Outcome: Progressing  Goal: Patient-Specific Goal (Individualization)  Outcome: Progressing  Goal: Absence of Hospital-Acquired Illness or Injury  Outcome: Progressing  Goal: Optimal Comfort and Wellbeing  Outcome: Progressing  Goal: Readiness for Transition of Care  Outcome: Progressing  Goal: Rounds/Family Conference  Outcome: Progressing     Problem: Infection  Goal: Infection Symptom Resolution  Outcome: Progressing     Problem: Pain Acute  Goal: Optimal Pain Control  Outcome: Progressing     Problem: Fall Injury Risk  Goal: Absence of Fall and Fall-Related Injury  Outcome: Progressing     Problem: Diabetes Comorbidity  Goal: Blood Glucose Level Within Desired Range  Outcome: Progressing     Problem: Pain Chronic (Persistent) (Comorbidity Management)  Goal: Acceptable Pain Control and Functional Ability  Outcome: Progressing

## 2018-04-11 NOTE — Unmapped (Signed)
Pt presents with report of dizziness, palpitations, CP, cough, and fever since Monday.

## 2018-04-11 NOTE — Unmapped (Signed)
Vancomycin Therapeutic Monitoring Pharmacy Note    Anna Cantrell is a 58 y.o. female starting vancomycin. Date of therapy initiation: 04/11/2018    Indication: Sepsis with unclear source, possible pericarditis    Prior Dosing Information: received a 2000 mg loading dose x 1 in the ED at 0229 this morning     Goals:  Therapeutic Drug Levels  Vancomycin trough goal: 15-20 mg/L    Additional Clinical Monitoring/Outcomes  Renal function, volume status (intake and output)    Results: Not applicable    Wt Readings from Last 1 Encounters:   04/10/18 88.5 kg (195 lb)     Creatinine   Date Value Ref Range Status   04/11/2018 1.77 (H) 0.60 - 1.00 mg/dL Final        Pharmacokinetic Considerations and Significant Drug Interactions:  ? Adult (estimated initial): Vd = 62.835 L, ke = 0.0367 hr-1  ? Concurrent nephrotoxic meds: not applicable    Assessment/Plan:  Recommendation(s)  ? Start vancomycin 1500 g IV q 24 hrs  ? Estimated trough on recommended regimen: 17 mg/L   ? Watch for accumulation with BMI > 36    Follow-up  ? Level due: prior to fourth or fifth dose.   ? A pharmacist will continue to monitor and order levels as appropriate    Please page service pharmacist with questions/clarifications.    Tanaisha Pittman T Christyna Letendre, RPh

## 2018-04-12 LAB — CBC W/ AUTO DIFF
BASOPHILS ABSOLUTE COUNT: 0.1 10*9/L (ref 0.0–0.1)
EOSINOPHILS ABSOLUTE COUNT: 0.2 10*9/L (ref 0.0–0.4)
EOSINOPHILS RELATIVE PERCENT: 1.1 %
HEMATOCRIT: 26.5 % — ABNORMAL LOW (ref 36.0–46.0)
HEMOGLOBIN: 8.7 g/dL — ABNORMAL LOW (ref 13.5–16.0)
LYMPHOCYTES ABSOLUTE COUNT: 2.3 10*9/L (ref 1.5–5.0)
LYMPHOCYTES RELATIVE PERCENT: 14 %
MEAN CORPUSCULAR HEMOGLOBIN CONC: 32.8 g/dL (ref 31.0–37.0)
MEAN CORPUSCULAR HEMOGLOBIN: 29.6 pg (ref 26.0–34.0)
MEAN CORPUSCULAR VOLUME: 90.3 fL (ref 80.0–100.0)
MEAN PLATELET VOLUME: 8.2 fL (ref 7.0–10.0)
MONOCYTES ABSOLUTE COUNT: 1 10*9/L — ABNORMAL HIGH (ref 0.2–0.8)
MONOCYTES RELATIVE PERCENT: 5.9 %
NEUTROPHILS ABSOLUTE COUNT: 12.4 10*9/L — ABNORMAL HIGH (ref 2.0–7.5)
NEUTROPHILS RELATIVE PERCENT: 75.5 %
PLATELET COUNT: 209 10*9/L (ref 150–440)
RED BLOOD CELL COUNT: 2.94 10*12/L — ABNORMAL LOW (ref 4.00–5.20)
RED CELL DISTRIBUTION WIDTH: 14.6 % (ref 12.0–15.0)
WBC ADJUSTED: 16.4 10*9/L — ABNORMAL HIGH (ref 4.5–11.0)

## 2018-04-12 LAB — BASIC METABOLIC PANEL
ANION GAP: 9 mmol/L (ref 7–15)
BLOOD UREA NITROGEN: 25 mg/dL — ABNORMAL HIGH (ref 7–21)
BUN / CREAT RATIO: 19
CALCIUM: 8.3 mg/dL — ABNORMAL LOW (ref 8.5–10.2)
CHLORIDE: 106 mmol/L (ref 98–107)
CO2: 19 mmol/L — ABNORMAL LOW (ref 22.0–30.0)
EGFR CKD-EPI AA FEMALE: 51 mL/min/{1.73_m2} — ABNORMAL LOW (ref >=60)
EGFR CKD-EPI NON-AA FEMALE: 44 mL/min/{1.73_m2} — ABNORMAL LOW (ref >=60)
SODIUM: 134 mmol/L — ABNORMAL LOW (ref 135–145)

## 2018-04-12 LAB — MAGNESIUM: Magnesium:MCnc:Pt:Ser/Plas:Qn:: 1.9

## 2018-04-12 LAB — CALCIUM: Calcium:MCnc:Pt:Ser/Plas:Qn:: 8.3 — ABNORMAL LOW

## 2018-04-12 LAB — MEAN CORPUSCULAR VOLUME: Lab: 90.3

## 2018-04-12 NOTE — Unmapped (Signed)
VSS, nausea controlled with prn medications, famitly at bedside, patient encouraged to call for assistance. Will continue to monitor.  Problem: Adult Inpatient Plan of Care  Goal: Plan of Care Review  Outcome: Progressing  Goal: Patient-Specific Goal (Individualization)  Outcome: Progressing  Goal: Absence of Hospital-Acquired Illness or Injury  Outcome: Progressing  Goal: Optimal Comfort and Wellbeing  Outcome: Progressing  Goal: Readiness for Transition of Care  Outcome: Progressing  Goal: Rounds/Family Conference  Outcome: Progressing

## 2018-04-12 NOTE — Unmapped (Signed)
Medicine Daily Progress Note    Assessment/Plan:  Principal Problem:    Gram-negative bacteremia  Active Problems:    SOB (shortness of breath)    HIV (human immunodeficiency virus infection) (CMS-HCC)    Abdominal discomfort    Dizziness on standing    Diabetes (CMS-HCC)    Hypertension    Chronic pain  Resolved Problems:    Sepsis (CMS-HCC)    Elevated serum creatinine           Anna Cantrell is a 58 y.o. female with well-controlled HIV, diabetes, chronic pain presenting with a week of fevers, cough, leukocytosis, abdominal pain and bloating found to have GNR bacteremia.    GNR bacteremia-Blood culturesis positive for 2 out of 2 for GNR bacteremia, speciation pending.  Likely due to a urinary source.  Since urine cultures obtained after antibiotics, may not be positive.  The patient is feeling improved with improved pain today.  -Let sensitivities and speciation's on blood cultures to determine antibiotic course, is sensitive to oral agents will likely be able to discharge on oral agents  -CTN cefepime while cultures pending, stop vancomycin (2/6- )     Tachycardia, Dizziness: This is improved from yesterday but the patient still has some dizziness upon standing.  -Will give another liter of LR, oral intake encouraged.     C/f Pericardial Calcifications -the patient denies chest pain, this is not consistent with acute pericarditis.  Per the TTE read, Interventricular septal bounce is noted which can be seen in constrictive pericarditis.     -Appreciate cardiology's recommendations  -Follow-up quant gold    AKI -creatinine improved from 1.77-1.3 with fluids.  Appears to be at baseline.    HIV-Viral load has been undetectable for years. CD4 >900 at last visit with Dr. Carita Pian 12/13/17.  CD4 count is greater than 400.  -Substitute dolutegravir, descovy for home Biktarvy  -Viral load pending     Chronic Medical Problems:   Chronic pain- continue home methadone 10 mg 4 times daily, Amtriptyline 100 qhs  DM: Lantus 40u nightly.  Sliding scale insulin.  Hold oral hypoglycemics.  Hypercholesterolemia:?? Continue statin  HTN:??Hold Clonidine 0.2 bid  Smoking: Declines nicotine patch    Dispo: Be able to discharge as soon as 2/7 pending sensitivities on blood cultures  ___________________________________________________________________    Subjective:  Patient states that she is feeling the best that she has felt in a week today.  Her nausea is improved today.  She still feels dizzy when sitting up.  She has been able to walk around the room.  She has not had a bowel movement since admission.  She still does note some shortness of breath.    Labs/Studies:  Labs and Studies from the last 24hrs per EMR and Reviewed      Objective:  Temp:  [36.9 ??C-37.4 ??C] 37.4 ??C  Heart Rate:  [93-108] 93  Resp:  [18] 18  BP: (115-122)/(67-68) 122/67  SpO2:  [97 %-99 %] 99 %    GEN: Standing up room, appears comfortable and in no acute distress  EYES: EOMI, anicteric sclera  ENT: MMM, no mucosal lesions seen  CV: RRR, no murmurs heard, peripheral pulses present  PULM: CTA B, normal work of breathing on room air  ABD: soft, mild tenderness to palpation above the suprapubic region, +BS, No CVA tenderness to palpation  EXT: No edema, warm extremities

## 2018-04-12 NOTE — Unmapped (Signed)
Pt alert and oriented. VSS. No falls this shift. Pt only c/o dizziness when sitting up. No other complaints at this time. No complaints of pain. Iv abx administered. Family at bedside. Will continue to monitor.     Problem: Adult Inpatient Plan of Care  Goal: Absence of Hospital-Acquired Illness or Injury  Outcome: Ongoing - Unchanged  Goal: Optimal Comfort and Wellbeing  Outcome: Ongoing - Unchanged     Problem: Infection  Goal: Infection Symptom Resolution  Outcome: Ongoing - Unchanged     Problem: Pain Acute  Goal: Optimal Pain Control  Outcome: Ongoing - Unchanged     Problem: Fall Injury Risk  Goal: Absence of Fall and Fall-Related Injury  Outcome: Ongoing - Unchanged     Problem: Diabetes Comorbidity  Goal: Blood Glucose Level Within Desired Range  Outcome: Ongoing - Unchanged

## 2018-04-13 LAB — POTASSIUM: Potassium:SCnc:Pt:Ser/Plas:Qn:: 4.1

## 2018-04-13 LAB — CBC
HEMATOCRIT: 27.4 % — ABNORMAL LOW (ref 36.0–46.0)
HEMOGLOBIN: 8.6 g/dL — ABNORMAL LOW (ref 13.5–16.0)
MEAN CORPUSCULAR HEMOGLOBIN CONC: 31.4 g/dL (ref 31.0–37.0)
MEAN CORPUSCULAR HEMOGLOBIN: 28.9 pg (ref 26.0–34.0)
MEAN CORPUSCULAR VOLUME: 91.9 fL (ref 80.0–100.0)
MEAN PLATELET VOLUME: 7.1 fL (ref 7.0–10.0)
PLATELET COUNT: 244 10*9/L (ref 150–440)
RED CELL DISTRIBUTION WIDTH: 14.9 % (ref 12.0–15.0)
WBC ADJUSTED: 12.5 10*9/L — ABNORMAL HIGH (ref 4.5–11.0)

## 2018-04-13 LAB — HIV RNA QNT RSLT: HIV 1 RNA:PrThr:Pt:Ser/Plas:Ord:Probe.amp.tar: NOT DETECTED

## 2018-04-13 LAB — BASIC METABOLIC PANEL
ANION GAP: 9 mmol/L (ref 7–15)
BLOOD UREA NITROGEN: 22 mg/dL — ABNORMAL HIGH (ref 7–21)
CALCIUM: 8.7 mg/dL (ref 8.5–10.2)
CHLORIDE: 111 mmol/L — ABNORMAL HIGH (ref 98–107)
CO2: 19 mmol/L — ABNORMAL LOW (ref 22.0–30.0)
CREATININE: 1.39 mg/dL — ABNORMAL HIGH (ref 0.60–1.00)
EGFR CKD-EPI AA FEMALE: 48 mL/min/{1.73_m2} — ABNORMAL LOW (ref >=60)
GLUCOSE RANDOM: 99 mg/dL (ref 65–179)
POTASSIUM: 4.1 mmol/L (ref 3.5–5.0)
SODIUM: 139 mmol/L (ref 135–145)

## 2018-04-13 LAB — HIV RNA, QUANTITATIVE, PCR

## 2018-04-13 LAB — HEMATOCRIT: Lab: 27.4 — ABNORMAL LOW

## 2018-04-13 MED ORDER — ACETAMINOPHEN 325 MG TABLET
Freq: Four times a day (QID) | ORAL | 0 refills | 0 days | PRN
Start: 2018-04-13 — End: 2018-06-15

## 2018-04-13 MED ORDER — LEVOFLOXACIN 750 MG TABLET
ORAL_TABLET | ORAL | 0 refills | 0.00000 days | Status: CP
Start: 2018-04-13 — End: 2018-04-18

## 2018-04-13 NOTE — Unmapped (Signed)
Patient alert and oriented X4, family at bedside. Pt has no c/o pain, VSS, NAD at this time. Patient verbalized understanding of discharge instructions. All questions answered  Problem: Adult Inpatient Plan of Care  Goal: Plan of Care Review  Outcome: Progressing  Goal: Patient-Specific Goal (Individualization)  Outcome: Progressing  Goal: Absence of Hospital-Acquired Illness or Injury  Outcome: Progressing  Goal: Optimal Comfort and Wellbeing  Outcome: Progressing  Goal: Readiness for Transition of Care  Outcome: Progressing  Goal: Rounds/Family Conference  Outcome: Progressing     Problem: Infection  Goal: Infection Symptom Resolution  Outcome: Progressing     Problem: Pain Acute  Goal: Optimal Pain Control  Outcome: Progressing     Problem: Diabetes Comorbidity  Goal: Blood Glucose Level Within Desired Range  Outcome: Progressing     Problem: Fall Injury Risk  Goal: Absence of Fall and Fall-Related Injury  Outcome: Progressing     Problem: Pain Chronic (Persistent) (Comorbidity Management)  Goal: Acceptable Pain Control and Functional Ability  Outcome: Progressing

## 2018-04-13 NOTE — Unmapped (Signed)
Estimated Creatinine Clearance: 44.1 mL/min (A) (based on SCr of 1.39 mg/dL (H)).

## 2018-04-13 NOTE — Unmapped (Signed)
Physician Discharge Summary HBR  2 DT HBR  [address]  [place] [address]  Dept: [phone]  Loc: [phone]     Identifying Information:   Anna Cantrell  May 06, 1960  [id number]    Primary Care Physician: Conard Novak, MD   Code Status: Full Code    Admit Date: 04/11/2018    Discharge Date: 04/13/2018     Discharge To: Home    Discharge Service: HBB: Hospitalist Service #1     Discharge Attending Physician: Anell Barr, MD    Discharge Diagnoses:  Principal Problem:    Gram-negative bacteremia POA: Unknown  Active Problems:    SOB (shortness of breath) POA: Yes    HIV (human immunodeficiency virus infection) (CMS-HCC) POA: Yes    Abdominal discomfort POA: Yes    Dizziness on standing POA: Yes    Diabetes (CMS-HCC) POA: Yes    Hypertension POA: Yes    Chronic pain POA: Yes  Resolved Problems:    Sepsis (CMS-HCC) POA: Yes    Elevated serum creatinine POA: Yes      Outpatient Provider Follow Up Issues:   Follow-up blood pressure at follow-up and restart home hypertension medications as is appropriate.  Outpatient volume evaluation for possible constrictive pericarditis  [ ]  Quant gold  [ ]  HIV viral loads    Hospital Course:     Anna Cantrell is a 58 y.o. female with well-controlled HIV, diabetes, chronic pain presenting with a week of fevers, cough, leukocytosis, abdominal pain and bloating found to have GNR bacteremia.  ??  E. coli bacteremia-the patient presented with sepsis, high fever and leukocytosis.  She improved with hydration and cefepime.  Blood cultures grew pansensitive E. coli, UA with signs of urinary tract infection.  Urine culture did not have growth but was obtained after the patient had already been started on antibiotics.  The patient rapidly improved on this treatment.  Patient will be discharged on renally dosed Levaquin to complete a 7-day course.  Although she is on QTC prolonging agents, ECG obtained during the hospitalization did not show QTC prolongation.  The patient has ID follow-up on February 19.  ??  Tachycardia, dizziness upon standing- The patient received several liters of fluid during the hospitalization.  IVC was assessed and was collapsible.  The patient felt better after IV fluids provement of her dizziness although she did note mild dizziness that was with ambulation.  Blood pressure remained low normal on discharge.  Home benzapril and clonidine are held on discharge.  The patient will follow-up with primary care provider early next week to reassess blood pressure.  Hydration was encouraged.  ??   C/f possible constrictive pericarditis -patient was noted to have possible calcifications on chest x-ray.  The patient did not have findings consistent with acute pericarditis.  Per the TTE read, Interventricular septal bounce is noted which can be seen in constrictive pericarditis.  Cardiology was consulted in-house and the patient will receive an outpatient referral for cardiology to further evaluate.  Quant gold is pending on discharge.  ??  AKI -and AKI which improved with IV fluids to baseline around 1.4.  ??  HIV-Viral load has been undetectable for years. CD4 >900 at last visit with Dr. Carita Pian 12/13/17.  CD4 count is greater than 400 and hospitalization.  Viral load is pending on discharge.  The patient will follow up with ID in February.    Smoking-declined nicotine patch.    Other medical issues per home regimen.  Procedures:  No admission procedures for hospital encounter.  ______________________________________________________________________  Discharge Medications:     Your Medication List      STOP taking these medications    benazepriL 40 MG tablet  Commonly known as:  LOTENSIN     cloNIDine HCl 0.2 MG tablet  Commonly known as:  CATAPRES     glipiZIDE 10 MG tablet  Commonly known as:  GLUCOTROL        START taking these medications    acetaminophen 325 MG tablet  Commonly known as:  TYLENOL  Take 2 tablets (650 mg total) by mouth every six (6) hours as needed.     levoFLOXacin 750 MG tablet  Commonly known as:  LEVAQUIN  Take 1 tablet (750 mg total) by mouth every other day for 5 days.        CONTINUE taking these medications    acyclovir 400 MG tablet  Commonly known as:  ZOVIRAX  Take 400 mg by mouth every eight (8) hours. As needed for outbreaks     amitriptyline 100 MG tablet  Commonly known as:  ELAVIL  Take 100 mg by mouth nightly.     aspirin 81 MG tablet  Commonly known as:  ECOTRIN  Take 81 mg by mouth daily.     BIKTARVY 50-200-25 mg tablet  Generic drug:  bictegrav-emtricit-tenofov ala  Take 1 tablet by mouth daily.     insulin glargine 100 unit/mL injection  Commonly known as:  LANTUS  Inject 40 Units under the skin nightly.     metFORMIN 1000 MG tablet  Commonly known as:  GLUCOPHAGE  Take 1,000 mg by mouth 2 (two) times a day with meals.     methadone 10 MG tablet  Commonly known as:  DOLOPHINE  Take 10 mg by mouth Four (4) times a day.     omeprazole 40 MG capsule  Commonly known as:  PriLOSEC  Take 40 mg by mouth daily.     pioglitazone 15 MG tablet  Commonly known as:  ACTOS  Take 15 mg by mouth daily.     pravastatin 20 MG tablet  Commonly known as:  PRAVACHOL  Take 20 mg by mouth daily.            Allergies:  Patient has no known allergies.  ______________________________________________________________________  Pending Test Results (if blank, then none):  Pending Labs     Order Current Status    HIV RNA, Quantitative, PCR In process    Quantiferon TB Gold Plus In process    Blood Culture Preliminary result    Blood Culture Preliminary result          Most Recent Labs:  All lab results last 24 hours -   Recent Results (from the past 24 hour(s))   POCT Glucose    Collection Time: 04/12/18  4:44 PM   Result Value Ref Range    Glucose, POC 184 (H) 70 - 179 mg/dL   POCT Glucose    Collection Time: 04/12/18  9:33 PM   Result Value Ref Range    Glucose, POC 262 (H) 70 - 179 mg/dL   POCT Glucose    Collection Time: 04/13/18  7:31 AM   Result Value Ref Range    Glucose, POC 94 70 - 179 mg/dL   CBC    Collection Time: 04/13/18  7:42 AM   Result Value Ref Range    WBC 12.5 (H) 4.5 - 11.0 10*9/L    RBC 2.98 (L)  4.00 - 5.20 10*12/L    HGB 8.6 (L) 13.5 - 16.0 g/dL    HCT 16.1 (L) 09.6 - 46.0 %    MCV 91.9 80.0 - 100.0 fL    MCH 28.9 26.0 - 34.0 pg    MCHC 31.4 31.0 - 37.0 g/dL    RDW 04.5 40.9 - 81.1 %    MPV 7.1 7.0 - 10.0 fL    Platelet 244 150 - 440 10*9/L   Basic Metabolic Panel    Collection Time: 04/13/18  7:42 AM   Result Value Ref Range    Sodium 139 135 - 145 mmol/L    Potassium 4.1 3.5 - 5.0 mmol/L    Chloride 111 (H) 98 - 107 mmol/L    CO2 19.0 (L) 22.0 - 30.0 mmol/L    Anion Gap 9 7 - 15 mmol/L    BUN 22 (H) 7 - 21 mg/dL    Creatinine 9.14 (H) 0.60 - 1.00 mg/dL    BUN/Creatinine Ratio 16     EGFR CKD-EPI Non-African American, Female 42 (L) >=60 mL/min/1.41m2    EGFR CKD-EPI African American, Female 48 (L) >=60 mL/min/1.37m2    Glucose 99 65 - 179 mg/dL    Calcium 8.7 8.5 - 78.2 mg/dL   POCT Glucose    Collection Time: 04/13/18 11:13 AM   Result Value Ref Range    Glucose, POC 90 70 - 179 mg/dL       Relevant Studies/Radiology (if blank, then none):  Xr Chest 2 Views    Result Date: 04/11/2018  EXAM: XR CHEST 2 VIEWS DATE: 04/11/2018 12:38 AM ACCESSION: [id number] UN DICTATED: 04/11/2018 12:55 AM INTERPRETATION LOCATION: [facility] CLINICAL INDICATION: 58 years old Female with FEVER  COMPARISON: None TECHNIQUE: PA and Lateral Chest Radiographs. FINDINGS: Lungs are well-inflated. No focal consolidative opacities or pulmonary edema. No pleural effusion or pneumothorax. Calcification seen in the pericardium anteriorly and inferiorly, which can be seen in the setting of pericarditis.     Pericardial calcifications, which may be seen in setting of pericarditis. Recommend correlation with clinical history. No pulmonary consolidations.    Echocardiogram W Colorflow Spectral Doppler    Result Date: 04/11/2018  ?? Left ventricular hypertrophy - mild ?? Normal left ventricular systolic function, ejection fraction > 55% ?? Degenerative mitral valve disease ?? Normal right ventricular systolic function ?? Concern for constrictive pericarditis (see details below)      ______________________________________________________________________  Discharge Instructions:           Other Instructions     Discharge instructions      You were seen in the hospital for blood infection likely due to the infection in your urine.  We will need to finish taking an antibiotic called Levaquin which she will start today for 5 more days.  You will take this medication every other day.  Be sure to stay well-hydrated.  Follow-up with your primary care physician early next week.  We have also scheduled an infectious disease follow-up for you.  You will stop taking your blood pressure medications for now, but please recheck at home earlier with your primary care doctor.  Thanks for letting us be part of your care team.               Follow Up instructions and Outpatient Referrals     Ambulatory referral to Cardiology      Discharge instructions            Appointments which have been scheduled for you  Apr 25, 2018 10:00 AM EST  (Arrive by 9:30 AM)  HOSPITAL FOLLOW UP with Hoyle Sauer, MD  [facility] INFECTIOUS DISEASES  [facility]) [address]  [place] [address]  [phone]           ______________________________________________________________________  Discharge Day Services:  BP 114/75  - Pulse 92  - Temp 36.2 ??C (Oral)  - Resp 18  - Ht 154.9 cm (5' 1)  - Wt 88.5 kg (195 lb 1.6 oz)  - SpO2 99%  - BMI 36.86 kg/m??   Pt seen on the day of discharge and determined appropriate for discharge.    Condition at Discharge: stable    Length of Discharge: I spent greater than 30 mins in the discharge of this patient.

## 2018-04-13 NOTE — Unmapped (Signed)
Patient alert and oriented X4, no c/o pain, VSS, NAD, family at bedside, IV abx given per order, bed in low position, patient encouraged to call for assistance.  Problem: Adult Inpatient Plan of Care  Goal: Plan of Care Review  Outcome: Progressing  Goal: Patient-Specific Goal (Individualization)  Outcome: Progressing  Goal: Absence of Hospital-Acquired Illness or Injury  Outcome: Progressing  Goal: Optimal Comfort and Wellbeing  Outcome: Progressing  Goal: Readiness for Transition of Care  Outcome: Progressing  Goal: Rounds/Family Conference  Outcome: Progressing

## 2018-04-13 NOTE — Unmapped (Signed)
POC discussed with patient.  No falls noted during my shift thus far and I will continue to monitor patient.  OOB independently.  No falls noted during my shift thus far and I will continue to monitor patient.  IV antibiotics given per orders.    Problem: Adult Inpatient Plan of Care  Goal: Plan of Care Review  Outcome: Progressing  Goal: Patient-Specific Goal (Individualization)  Outcome: Progressing  Goal: Absence of Hospital-Acquired Illness or Injury  Outcome: Progressing  Goal: Optimal Comfort and Wellbeing  Outcome: Progressing  Goal: Readiness for Transition of Care  Outcome: Progressing  Goal: Rounds/Family Conference  Outcome: Progressing     Problem: Infection  Goal: Infection Symptom Resolution  Outcome: Progressing     Problem: Pain Acute  Goal: Optimal Pain Control  Outcome: Progressing     Problem: Fall Injury Risk  Goal: Absence of Fall and Fall-Related Injury  Outcome: Progressing     Problem: Diabetes Comorbidity  Goal: Blood Glucose Level Within Desired Range  Outcome: Progressing     Problem: Pain Chronic (Persistent) (Comorbidity Management)  Goal: Acceptable Pain Control and Functional Ability  Outcome: Progressing

## 2018-04-13 NOTE — Unmapped (Signed)
Antibiotic Timeout Checklist  Indication for antibiotics: Bacteremia  Antibiotic Start Date: 04/11/18  Current systemic antibiotics: Cefepime  Microbiology Results: E Coli, pan sensitive  Sensitivities Available? Yes  Are antibiotics still indicated? YES  Is it appropriate to de-escalate? YES  Is it appropriate to convert to PO therapy? YES  Today's antibiotic plan: Change antibiotics to levofloxacin  Planned Antibiotic Duration: 7 days

## 2018-04-14 LAB — QUANTIFERON TB GOLD PLUS
QUANTIFERON ANTIGEN 1 MINUS NIL: -0.01 [IU]/mL
QUANTIFERON TB GOLD PLUS: NEGATIVE
QUANTIFERON TB NIL VALUE: 0.05 [IU]/mL

## 2018-04-14 LAB — QUANTIFERON TB NIL VALUE: Lab: 0.05

## 2018-04-15 NOTE — Unmapped (Signed)
Reason for Disposition  ??? [1] MILD swelling of both ankles (i.e., pedal edema) AND [2] new onset or worsening    Answer Assessment - Initial Assessment Questions  1. ONSET: When did the swelling start? (e.g., minutes, hours, days)   started yesterday morning   2. LOCATION: What part of the leg is swollen?  Are both legs swollen or just one 3. SEVERITY: How bad is the swelling? (e.g., localized; mild, moderate, severe)   - Localized - small area of swelling localized to one leg   - MILD pedal edema - swelling limited to foot and ankle, pitting edema < 1/4 inch (6 mm) deep, rest and elevation eliminate most or all swelling   - MODERATE edema - swelling of lower leg to knee, pitting edema > 1/4 inch (6 mm) deep, rest and elevation only partially reduce swelling   - SEVERE edema - swelling extends above knee, facial or hand swelling present   mild  4. REDNESS: Does the swelling look red or infected?   denies  5. PAIN: Is the swelling painful to touch? If so, ask: How painful is it?   (Scale 1-10; mild, moderate or severe)  Pain no different from before  6. FEVER: Do you have a fever? If so, ask: What is it, how was it measured, and when did it start?    denies  7. CAUSE: What do you think is causing the leg swelling?  unsure  8. MEDICAL HISTORY: Do you have a history of heart failure, kidney disease, liver failure, or cancer?  denies  52. RECURRENT SYMPTOM: Have you had leg swelling before? If so, ask: When was the last time? What happened that time?  Last occurred 2 weeks and just away on it's won after keeping her feet up.  10. OTHER SYMPTOMS: Do you have any other symptoms? (e.g., chest pain, difficulty breathing)  A little sob starting 2 weeks ago.  11. PREGNANCY: Is there any chance you are pregnant? When was your last menstrual period?  Denies,LMP stopped years ago.    Protocols used: LEG SWELLING AND EDEMA-A-AH

## 2018-04-17 NOTE — Unmapped (Signed)
Duration of Intervention: 5 Minutes    SW provided pt with 2 RWC parking passes today to assist with the cost of parking since pt is eligible for RW services.    Arlyn Dunning, MSW, LCSW

## 2018-04-25 ENCOUNTER — Ambulatory Visit: Admit: 2018-04-25 | Discharge: 2018-04-26 | Payer: MEDICARE

## 2018-04-25 DIAGNOSIS — A419 Sepsis, unspecified organism: Principal | ICD-10-CM

## 2018-04-25 LAB — COMPREHENSIVE METABOLIC PANEL
ALBUMIN: 3.9 g/dL (ref 3.5–5.0)
ALKALINE PHOSPHATASE: 126 U/L (ref 38–126)
ALT (SGPT): 14 U/L (ref <35)
ANION GAP: 12 mmol/L (ref 7–15)
AST (SGOT): 20 U/L (ref 14–38)
BILIRUBIN TOTAL: 0.2 mg/dL (ref 0.0–1.2)
BLOOD UREA NITROGEN: 16 mg/dL (ref 7–21)
BUN / CREAT RATIO: 12
CALCIUM: 10 mg/dL (ref 8.5–10.2)
CHLORIDE: 105 mmol/L (ref 98–107)
EGFR CKD-EPI AA FEMALE: 49 mL/min/{1.73_m2} — ABNORMAL LOW (ref >=60)
EGFR CKD-EPI NON-AA FEMALE: 43 mL/min/{1.73_m2} — ABNORMAL LOW (ref >=60)
GLUCOSE RANDOM: 183 mg/dL — ABNORMAL HIGH (ref 70–179)
POTASSIUM: 5 mmol/L (ref 3.5–5.0)
SODIUM: 142 mmol/L (ref 135–145)

## 2018-04-25 LAB — CBC W/ AUTO DIFF
BASOPHILS ABSOLUTE COUNT: 0.1 10*9/L (ref 0.0–0.1)
BASOPHILS RELATIVE PERCENT: 0.7 %
EOSINOPHILS ABSOLUTE COUNT: 0.2 10*9/L (ref 0.0–0.4)
EOSINOPHILS RELATIVE PERCENT: 2.2 %
HEMOGLOBIN: 10.2 g/dL — ABNORMAL LOW (ref 12.0–16.0)
LARGE UNSTAINED CELLS: 2 % (ref 0–4)
LYMPHOCYTES ABSOLUTE COUNT: 3.7 10*9/L (ref 1.5–5.0)
LYMPHOCYTES RELATIVE PERCENT: 35.9 %
MEAN CORPUSCULAR HEMOGLOBIN CONC: 29.1 g/dL — ABNORMAL LOW (ref 31.0–37.0)
MEAN CORPUSCULAR HEMOGLOBIN: 27.7 pg (ref 26.0–34.0)
MEAN CORPUSCULAR VOLUME: 95.3 fL (ref 80.0–100.0)
MEAN PLATELET VOLUME: 7.4 fL (ref 7.0–10.0)
MONOCYTES ABSOLUTE COUNT: 0.4 10*9/L (ref 0.2–0.8)
NEUTROPHILS ABSOLUTE COUNT: 5.8 10*9/L (ref 2.0–7.5)
NEUTROPHILS RELATIVE PERCENT: 55.8 %
PLATELET COUNT: 431 10*9/L (ref 150–440)
RED BLOOD CELL COUNT: 3.67 10*12/L — ABNORMAL LOW (ref 4.00–5.20)
WBC ADJUSTED: 10.4 10*9/L (ref 4.5–11.0)

## 2018-04-25 LAB — SMEAR REVIEW

## 2018-04-25 LAB — EGFR CKD-EPI AA FEMALE: Lab: 49 — ABNORMAL LOW

## 2018-04-25 LAB — MONOCYTES ABSOLUTE COUNT: Lab: 0.4

## 2018-04-29 NOTE — Unmapped (Signed)
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 Her urine culture (drawn after abx started) returned negative and the prominence of her abdominal pain on presentation raises the spectre of possible intraabdominal, bladder-adjacent pathology as the source of her gm negative bacteremia, but this is less likely as she has remained free of abdominal pain and fever 5 days off abx.   SHe continues to have some significant LE edema, but suspect this is due to fluid shifts from the necessary aggressive IVF she had upon initial presentation. As she has no significant pain from the edema itself, and does not appear to have any compromise to breathing, we discussed holding off on any pharmacologic intervention with diuretics in order to avoid any further renal injury, and continue to give it time to improve. Discussed elevating her legs as much as possible-she did mention she plays candy crush standing up 'a lot-she will start playing sitting down with her legs propped up. I will be interested in hearing what cardiology makes of her TTE and clinical presentation in f/u.      Recommendations:  -Continue to hold home antihypertensives (clonidine, ?benazepril)-continue to monitor closely for any need to resume in f/u  -f/u with cardiology as planned 3/9  -continue outpatient HIV regimen  -safety labs today given recent hospitalization (CBC w/diff, CMP)  -f/u as scheduled with Dr.Margolis in ~2 months      Truddie Hidden, MD  Assistant Professor, [facility] Division of Infectious Diseases  Pager: [phone]      HPI:    58 yo woman with a h/o well controlled HIV (VL undetectable, CD4 in 700s 59/2019) who is seen in clinic for f/u of a recent hospitalization for E.coli bacteremia.  She was in her USOH when she began noticing abdominal pain, lightheadedness, fatigue, and LE edema in the week prior to presentation. Symptoms worsened to the point where she presented to the ER.  She was found to be febrile and tachycardic on presentation and was given IVF, broad spectrum abx after cultures taken and admitted to the hospitalist service. Blood cultures returned with E.coli and her UA showed significant pyuria (>100 wBC), and after rapid clinical improvement she was narrowed to levaquin and discharged to home to complete a 7 day course.   During that hospitalization she also had a TTE performed which showed an interventircular septal bounce, c/f possible constrictive pericarditis. Qauntiferon gold was negative. She was seen by cardiology in house per discharge summary, although I could not find a consult note from them during her stay. A progress note from 2/5 does mention a conversation with cardiology in which cardiology thought her calcifications on CXR and TTE were likely incidental. She has plans to f/u with them on 3/9.  Her BP remained low normal even after resolution of sepsis and with holding of her home antihypertensives, so she was discharged off her chronic antihypertensives (benzapril and clonidine) with plans to f/u in clinic today.     Since discharge her main complaint has been continued b/l LE edema. She says she first noticed it when she became ill the week before her  hospital stay, that it was worse the first day or two after going home, but has slowly improved. It is worse at the end of the day and almost completely gone when she wakes up in the morning. No pain, but says it is uncomfortable/unpleasant. No redness or rashes. SOB much better. No orthopnea. No f/c, dysuria, flank pain, or return of abdominal pain. She does still feel a little lightheaded with standing, but says it is significantly better than when she was in the hospital.  [facility] finished her Levaquin on 2/14.  SHe has continued to hold her clonidine. She says she had not actually been on her benazepril for quite some time before her hospital stay.     Medications:  Antimicrobials:  None current      Other medications reviewed.    Objective     BP 125/80  - Pulse 91  - Temp 36.6 ??C (97.9 ??F)  - Wt 88.5 kg (195 lb)  - BMI 36.84 kg/m??       Physical Exam:  GEN: well appearing, in NAD  HEEN: NCAT, EOMI, no oral lesions  Lymphadenopathy: no LAD in neck  CV: regular rate and rhythm no murmurs/rubs/gallops  No JVD  Lungs: clear to auscultation b/l  Abdomen: soft, nontender to palpation, normoactive BS, no HSM  Ext: 2+ b/l LE pitting edema  No associated erythema  Skin: no rashes or lesions  Neuro: CNI-XII intact, normal strength and sensation  Pyschiatric: normal affect, interactive      Labs:    Microbiology:    04/10/2018: RVP negative  04/11/2018 HIV VL not detected  04/11/2018 2/2  Blood Culture, Routine   Lab   Escherichia coliAbnormal   [facility]         ??   Gram Stain Result   Lab   Gram negative bacilli [facility]            Susceptibility      Escherichia coli     KIRBY BAUER     Ampicillin Susceptible     Cefazolin Susceptible     Gentamicin Susceptible     Levofloxacin Susceptible     Piperacillin + Tazobactam Susceptible     Tobramycin Susceptible                Quantiferon gold: negative    Imaging:   04/11/18: TTE  Interpretation Summary     ?? Normal left ventricular systolic function, ejection fraction > 55%  ?? Mitral annular calcification  ?? Degenerative mitral valve disease  ?? Normal right ventricular systolic function  ?? Some echocardiographic features suggestive of constrictive physiology (see detail below)      Echocardiographic Findings     Left Ventricle The left ventricle is normal in size with normal wall thickness.   The left ventricular systolic function is normal.   The ejection fraction is visually estimated at > 55%.  There is normal left ventricular diastolic function.   Abnormal septal wall motion is noted.   There is exaggerated septal motion (aka septal bounce) and annulus reversus, both of which can be consistent with constrictive physiology. However, the IVC is small and collapsible, which would be unusual in the setting of clinically significant constriction. Clinical correlation recommended.       04/11/2018 CXR:   Officially read as clear  My read (unofficial)-query if there is some possible vascular fullness

## 2018-04-30 NOTE — Unmapped (Signed)
[facility] Refill Coordination Note  Specialty Medication(s): BIKTARVY      Anna Cantrell, DOB: 05-03-1960  Phone: [phone] (home) , Alternate phone contact: N/A  Phone or address changes today?: No  All above HIPAA information was verified with patient.  Shipping Address: [address]  [place] [address]   Insurance changes? No    Completed refill call assessment today to schedule patient's medication shipment from the [facility] [phone]).      Confirmed the medication and dosage are correct and have not changed: Yes, regimen is correct and unchanged.    Confirmed patient started or stopped the following medications in the past month:  No, there are no changes reported at this time.    Are you tolerating your medication?:  Anna Cantrell reports tolerating the medication.    ADHERENCE        Did you miss any doses in the past 4 weeks? No missed doses reported.    FINANCIAL/SHIPPING    Delivery Scheduled: Yes, Expected medication delivery date: 2/28     Medication will be delivered via Next Day Courier to the home address in [facility].    The patient will receive a drug information handout for each medication shipped and additional FDA Medication Guides as required.      Anna Cantrell did not have any additional questions at this time.    We will follow up with patient monthly for standard refill processing and delivery.      Thank you,  [name]   [facility] Specialty Technician

## 2018-05-02 MED ORDER — METHADONE 10 MG TABLET
ORAL_TABLET | Freq: Four times a day (QID) | ORAL | 0 refills | 0.00000 days | Status: CP
Start: 2018-05-02 — End: 2018-05-30

## 2018-05-03 MED FILL — BIKTARVY 50 MG-200 MG-25 MG TABLET: 30 days supply | Qty: 30 | Fill #4 | Status: AC

## 2018-05-03 MED FILL — BIKTARVY 50 MG-200 MG-25 MG TABLET: ORAL | 30 days supply | Qty: 30 | Fill #4

## 2018-05-14 ENCOUNTER — Ambulatory Visit: Admit: 2018-05-14 | Discharge: 2018-05-15 | Payer: MEDICARE

## 2018-05-14 DIAGNOSIS — E782 Mixed hyperlipidemia: Principal | ICD-10-CM

## 2018-05-14 DIAGNOSIS — E119 Type 2 diabetes mellitus without complications: Principal | ICD-10-CM

## 2018-05-14 DIAGNOSIS — E0800 Diabetes mellitus due to underlying condition with hyperosmolarity without nonketotic hyperglycemic-hyperosmolar coma (NKHHC): Principal | ICD-10-CM

## 2018-05-14 DIAGNOSIS — I311 Chronic constrictive pericarditis: Principal | ICD-10-CM

## 2018-05-14 DIAGNOSIS — F329 Major depressive disorder, single episode, unspecified: Principal | ICD-10-CM

## 2018-05-14 DIAGNOSIS — I10 Essential (primary) hypertension: Principal | ICD-10-CM

## 2018-05-14 DIAGNOSIS — Z794 Long term (current) use of insulin: Principal | ICD-10-CM

## 2018-05-14 DIAGNOSIS — R9389 Abnormal findings on diagnostic imaging of other specified body structures: Principal | ICD-10-CM

## 2018-05-14 DIAGNOSIS — K219 Gastro-esophageal reflux disease without esophagitis: Principal | ICD-10-CM

## 2018-05-14 DIAGNOSIS — G8929 Other chronic pain: Principal | ICD-10-CM

## 2018-05-14 DIAGNOSIS — E785 Hyperlipidemia, unspecified: Principal | ICD-10-CM

## 2018-05-14 DIAGNOSIS — N183 Chronic kidney disease, stage 3 (moderate): Principal | ICD-10-CM

## 2018-05-14 DIAGNOSIS — F17200 Nicotine dependence, unspecified, uncomplicated: Principal | ICD-10-CM

## 2018-05-14 DIAGNOSIS — B20 Human immunodeficiency virus [HIV] disease: Principal | ICD-10-CM

## 2018-05-14 DIAGNOSIS — Z21 Asymptomatic human immunodeficiency virus [HIV] infection status: Principal | ICD-10-CM

## 2018-05-14 MED ORDER — FUROSEMIDE 20 MG TABLET
ORAL_TABLET | Freq: Every day | ORAL | 6 refills | 0 days | Status: CP
Start: 2018-05-14 — End: 2018-07-03

## 2018-05-14 NOTE — Unmapped (Signed)
Duration of Intervention: 5 minutes    SW provided pt with 2 RWC parking passes to assist with the cost of parking today since pt is eligible for RW services.     Bradly Bienenstock LCSWA, CHES

## 2018-05-14 NOTE — Unmapped (Signed)
DIVISION OF CARDIOLOGY  [facility], [place]        Date of Service: 05/14/2018      PCP: Referring Provider:   Conard Novak, MD  [id number]  [place] [address]  Phone: [phone]  Fax: [phone] Anna Barr, MD  [address]  [id number]  [place], [place] [address]  Phone: [phone]  Fax: [phone]     Assessment and Plan:   1. Constrictive pericarditis  Concern for constrictive physiology on echocardiogram with evidence of mild volume overload on exam today.  Plan:  - Given her mild volume overload state on exam, we will start her on gentle diuretics with p.o. Lasix 20 mg daily until her pedal edema resolves.  We will also check baseline labs including Pro-BNP age and Basic Metabolic Panel.  - It is most likely that her pericardial calcifications are related to her active smoking history and secondhand tobacco exposure.  We discussed at length about smoking cessation and its importance given this finding.  - We will however rule out other causes for pericardial calcifications and check labs including an ESR, CRP, ANA, RF and TSH.  - MRI Cardiac Morph W Wo Contrast; Future-to evaluate for constrictive physiology.    2. Essential hypertension  Well controlled off medications- her Clonidine and Benazepril have bene held since her recent discharge from the hospital.    3. Diabetes mellitus due to underlying condition with hyperosmolarity without coma, with long-term current use of insulin (CMS-HCC)  Lab Results   Component Value Date    A1C 8.2 (H) 12/13/2017   Not optimally controlled on the current regimen of Metformin, Jardiance, glipizide and insulin.    4. CKD (chronic kidney disease) stage 3, GFR 30-59 ml/min (CMS-HCC)  Lab Results   Component Value Date    CREATININE 1.36 (H) 04/25/2018     5. Asymptomatic HIV infection (CMS-HCC)  Lab Results   Component Value Date    CD4 27 (L) 04/11/2018   Viral load has been undetectable for years. Follows with Dr. Carita Pian 12/13/17. She did change medication regimens to [facility] in late 2019, reports good adherence.     6. Mixed hyperlipidemia  Well controled on Pravastatin 20 mg daily.  Lab Results   Component Value Date    CHOL 256 03/18/2013     Lab Results   Component Value Date    HDL 119 03/18/2013     Lab Results   Component Value Date    LDL 67.3 12/07/2016     Lab Results   Component Value Date    TRIG 351 03/18/2013       7. Tobacco use disorder  Active smoker, but also significant exposure to second hand smoke.  It is most likely that her pericardial calcifications are related to her active smoking history and secondhand tobacco exposure.  We discussed at length about smoking cessation and its importance given this finding.    8. Abnormal findings on diagnostic imaging of other specified body structures   Recent CXR showing pericardial calcifications, which may be seen in setting of pericarditis. It is most likely that her pericardial calcifications are related to her active smoking history and secondhand tobacco exposure.  We discussed at length about smoking cessation and its importance given this finding.      The 10-year ASCVD risk score Denman George DC Jr., et al., 2013) is: 21.1%    Values used to calculate the  score:      Age: 58 years      Sex: Female      Is Non-Hispanic African American: Yes      Diabetic: Yes      Tobacco smoker: Yes      Systolic Blood Pressure: 128 mmHg      Is BP treated: Yes      HDL Cholesterol: 119 mg/dL      Total Cholesterol: 256 mg/dL    Note: For patients with SBP <90 or >200, Total Cholesterol <130 or >320, HDL <20 or >100 which are outside of the allowable range, the calculator will use these upper or lower values to calculate the patient???s risk score.        Return in about 4 weeks (around 06/11/2018) for Recheck.      Subjective:        Reason for Consultation: Concern for pericardial constriction    History of Present Illness: Anna Cantrell is a 58 y.o. female with insulin-dependent diabetes, hypertension, stage III CKD, HIV infection, hyperlipidemia, active tobacco abuse and chronic pain syndrome.  The patient is seen at the request of Anna Cantrell for evaluation of pericardial constriction.    The patient was recently hospitalized in February 2023 [facility] when she originally presented with sepsis, high fevers and leukocytosis.  Her blood cultures at the time grew pansensitive E. coli which was thought to be from a urinary source.  Given her tachycardia and orthostatic symptoms she had an echocardiogram performed which showed evidence of interventricular septal bounce but a collapsible IVC.  Her chest x-ray also showed significant pericardial calcifications and hence she was referred to cardiology for concern for pericardial constriction.    Since her discharge the patient does note some mild worsening of her baseline dyspnea on exertion but she states that her dyspnea has worsened especially when walking uphill. She also endorses worsening pedal edema.  Symptoms are consistent with a mild volume overload state.  Given her pericardial calcifications she had a QuantiFERON gold testing done which was negative for TB.  She continues to be an active smoker and also is exposed to significant secondhand smoke.  She has no plans to quit smoking at this time.  She has no history of radiation to her chest.    She denies any exertional chest pain.  She denies any palpitations, dizziness or syncope.  She denies any PND or orthopnea but does have worsening pedal edema.      Cardiovascular History:  Constrictive physiology on echocardiogram 04/2018    I have independently reviewed of all the diagnostic studies. I have reviewed the old medical records from [facility] prior to this clinic visit.     Cardiovascular Studies Date Comments     ECG 05/14/2018 NSR with 1st degree AV block, rate 93 bpm.  Rightward axis.  Non-specific T wave abnormalities with prolonged QT interval (QTc: 460 msec). Echo 04/11/2018 ?? Normal left ventricular systolic function, ejection fraction > 55%  ?? Mitral annular calcification  ?? Degenerative mitral valve disease  ?? Normal right ventricular systolic function  ?? Some echocardiographic features suggestive of constrictive physiology    Stress test     Cardiac catheterization     Chest X-ray 04/11/2018 Pericardial calcifications, which may be seen in setting of pericarditis. Recommend correlation with clinical history.  ??  No pulmonary consolidations.   Electrophysiology      Cardiovascular Surgery     Peripheral Vascular Studies     I have personally  reviewed the recent imaging studies on this patient.    Past medical history:  Patient Active Problem List   Diagnosis   ??? SOB (shortness of breath)   ??? HIV (human immunodeficiency virus infection) (CMS-HCC)   ??? Abdominal discomfort   ??? Dizziness on standing   ??? Diabetes (CMS-HCC)   ??? Hypertension   ??? Chronic pain   ??? Gram-negative bacteremia   ??? Chronic pain   ??? Gastroesophageal reflux disease   ??? Sensory hearing loss, bilateral   ??? Human immunodeficiency virus (HIV) infection (CMS-HCC)   ??? Hypertension   ??? Neuropathy   ??? Type 2 diabetes mellitus with stage 3 chronic kidney disease, with long-term current use of insulin (CMS-HCC)   ??? Cochlear hearing loss, bilateral   ??? Mixed hyperlipidemia   ??? CKD (chronic kidney disease) stage 3, GFR 30-59 ml/min (CMS-HCC)   ??? Tobacco use disorder   ??? Constrictive pericarditis       Medications:   Patient's Medications   New Prescriptions    FUROSEMIDE (LASIX) 20 MG TABLET    Take 1 tablet (20 mg total) by mouth daily.   Previous Medications    ACETAMINOPHEN (TYLENOL) 325 MG TABLET    Take 2 tablets (650 mg total) by mouth every six (6) hours as needed.    ACYCLOVIR (ZOVIRAX) 400 MG TABLET    TAKE 1 TABLET BY MOUTH THREE TIMES DAILY AS NEEDED    ACYCLOVIR (ZOVIRAX) 400 MG TABLET    Take 400 mg by mouth every eight (8) hours. As needed for outbreaks    ALBUTEROL HFA 90 MCG/ACTUATION INHALER INHALE 2 PUFFS EVERY 6 HOURS AS NEEDED FOR WHEEZING    AMITRIPTYLINE (ELAVIL) 100 MG TABLET    TAKE 1 TABLET BY MOUTH NIGHTLY AS NEEDED FOR SLEEP    AMITRIPTYLINE (ELAVIL) 100 MG TABLET    Take 100 mg by mouth nightly.    ASPIRIN (ECOTRIN) 81 MG TABLET    TAKE 1 TABLET BY MOUTH ONCE DAILY    ASPIRIN (ECOTRIN) 81 MG TABLET    Take 81 mg by mouth daily.    BENAZEPRIL (LOTENSIN) 40 MG TABLET    Take 1 tablet (40 mg total) by mouth daily.    BICTEGRAV-EMTRICIT-TENOFOV ALA (BIKTARVY) 50-200-25 MG TABLET    Take 1 tablet by mouth daily.    BICTEGRAV-EMTRICIT-TENOFOV ALA (BIKTARVY) 50-200-25 MG TABLET    Take 1 tablet by mouth daily.    BLOOD SUGAR DIAGNOSTIC STRP    Check every morning before breakfast.    BLOOD-GLUCOSE METER (PRODIGY AUTOCODE METER) KIT    Use as instructed    CLONIDINE HCL (CATAPRES) 0.2 MG TABLET    TAKE 1 TABLET BY MOUTH TWICE DAILY    GLIPIZIDE (GLUCOTROL) 10 MG TABLET    TAKE 1 TABLET BY MOUTH TWICE DAILY (30 MINUTES BEFORE A MEAL)    INSULIN GLARGINE (BASAGLAR, LANTUS) 100 UNIT/ML (3 ML) INJECTION PEN    INJECT 27 UNITS UNDER THE SKIN NIGHTLY . MAY INCREASE UP TO 50 UNITS PER DAY AS DIRECTED BY ENDOCRINE CLINIC    INSULIN GLARGINE (LANTUS) 100 UNIT/ML INJECTION    Inject 40 Units under the skin nightly.    JARDIANCE 25 MG TAB    Take 1 tablet by mouth daily.     LANCETS (PRODIGY LANCETS) 28 GAUGE MISC    Use to test blood sugar 3 times daily    METFORMIN (GLUCOPHAGE) 1000 MG TABLET    TAKE 1 TABLET BY MOUTH TWICE DAILY WITH MEALS  METFORMIN (GLUCOPHAGE) 1000 MG TABLET    Take 1,000 mg by mouth 2 (two) times a day with meals.    METHADONE (DOLOPHINE) 10 MG TABLET    Take 1 tablet (10 mg total) by mouth Four (4) times a day.    OMEPRAZOLE (PRILOSEC) 40 MG CAPSULE    TAKE 1 CAPSULE BY MOUTH ONCE DAILY    OMEPRAZOLE (PRILOSEC) 40 MG CAPSULE    Take 40 mg by mouth daily.    PIOGLITAZONE (ACTOS) 15 MG TABLET    Take 15 mg by mouth daily.    PIOGLITAZONE (ACTOS) 15 MG TABLET    Take 15 mg by mouth daily.    PRAVASTATIN (PRAVACHOL) 20 MG TABLET    TAKE 1 TABLET BY MOUTH ONCE DAILY    PRAVASTATIN (PRAVACHOL) 20 MG TABLET    Take 20 mg by mouth daily.     UNIFINE PENTIPS 32 GAUGE X 5/32 NDLE    USE TO INJECT LANTUS NIGHTLY   Modified Medications    No medications on file   Discontinued Medications    No medications on file       Allergies:  Allergies   Allergen Reactions   ??? Hydrocodone-Acetaminophen Hives   ??? Tramadol Hives       Social History:  She  reports that she has been smoking cigarettes. She has never used smokeless tobacco. She reports previous alcohol use. She reports previous drug use.    Family History:  Her She was adopted. Family history is unknown by patient.    Review of Systems  10 systems were reviewed and negative except as noted in HPI.      Objective:       Physical Exam  BP 128/84 (BP Site: L Arm, BP Position: Sitting, BP Cuff Size: Medium)  - Pulse 80  - Ht 156.2 cm (5' 1.5)  - Wt 86.5 kg (190 lb 11.2 oz)  - BMI 35.45 kg/m??    Wt Readings from Last 3 Encounters:   05/14/18 86.5 kg (190 lb 11.2 oz)   04/25/18 88.5 kg (195 lb)   04/11/18 88.5 kg (195 lb 1.6 oz)       General:  Alert, no distress.   Eyes:  Intact, sclerae anicteric.   Ears, nose, mouth: Moist mucous membranes.Supple, no carotid bruit.    Respiratory:   CTAB bilaterally with normal WOB.   Cardiovascular:  No carotid bruit, JVD normal at 90 degrees, RRR without m/r/g.Distant heart sounds.  2+ pitting edema bilaterally.   Gastrointestinal:   Normal bowel sounds, soft, NTND.   Musculoskeletal: Normal strength   Skin: Warm, well perfused.   Neurologic: No focal deficits.       Most recent labs   Lab Results   Component Value Date    Sodium 142 04/25/2018    Potassium 5.0 04/25/2018    Chloride 105 04/25/2018    CO2 25.0 04/25/2018    BUN 16 04/25/2018    Creatinine 1.36 (H) 04/25/2018    Magnesium 1.9 04/12/2018     Lab Results   Component Value Date    HGB 10.2 (L) 04/25/2018    MCV 95.3 04/25/2018    Platelet 431 04/25/2018 Lab Results   Component Value Date    Cholesterol 256 03/18/2013    Triglycerides 351 03/18/2013    HDL 119 03/18/2013    LDL Calculated 67 03/18/2013    LDL Direct 67.3 12/07/2016    HGB A1C, POC 12.1 (H) 08/12/2015    Hemoglobin A1C  8.2 (H) 12/13/2017    Hemoglobin A1C 9.5 (A) 05/13/2016    PRO-BNP 818.0 (H) 04/11/2018            *Patient note was created using dragon dictation. Any errors in syntax or proofreading may not have been identified and edited on initial review prior to signing this note.

## 2018-05-22 NOTE — Unmapped (Signed)
[facility] Refill Coordination Note    Specialty Medication(s) to be Shipped:   Infectious Disease: Biktarvy    Other medication(s) to be shipped: Metformin 1000mg      Anna Cantrell, DOB: 1960/10/28  Phone: [phone] (home)       All above HIPAA information was verified with patient.     Completed refill call assessment today to schedule patient's medication shipment from the [facility] [phone]).       Specialty medication(s) and dose(s) confirmed: Regimen is correct and unchanged.   Changes to medications: Anna Cantrell Reports stopping the following medications: Glipizide and Benazepril  Changes to insurance: No  Questions for the pharmacist: No    Confirmed patient received Welcome Packet with first shipment. The patient will receive a drug information handout for each medication shipped and additional FDA Medication Guides as required.       DISEASE/MEDICATION-SPECIFIC INFORMATION        N/A    SPECIALTY MEDICATION ADHERENCE     Medication Adherence    Patient reported X missed doses in the last month:  0  Specialty Medication:  Biktarvy  Patient is on additional specialty medications:  No  Patient is on more than two specialty medications:  No  Any gaps in refill history greater than 2 weeks in the last 3 months:  no  Demonstrates understanding of importance of adherence:  yes  Informant:  patient                Biktarvy 50-200-25mg  : Patient has 14 days of medication on hand      SHIPPING     Shipping address confirmed in Epic.     Delivery Scheduled: Yes, Expected medication delivery date: 05/30/18.     Medication will be delivered via Next Day Courier to the home address in Epic WAM.    Anna Cantrell   [facility] Specialty Technician

## 2018-05-24 ENCOUNTER — Ambulatory Visit: Admit: 2018-05-24 | Discharge: 2018-05-25 | Payer: MEDICARE

## 2018-05-24 DIAGNOSIS — I311 Chronic constrictive pericarditis: Principal | ICD-10-CM

## 2018-05-24 LAB — CREATININE, WHOLE BLOOD
CREATININE, WHOLE BLOOD: 1.5 mg/dL — ABNORMAL HIGH (ref 0.7–1.1)
EGFR CKD-EPI AA FEMALE (WB): 44 mL/min/1.73m2 — ABNORMAL LOW (ref >=60–1.1)

## 2018-05-29 MED FILL — METFORMIN 1,000 MG TABLET: ORAL | 90 days supply | Qty: 180 | Fill #1

## 2018-05-29 MED FILL — BIKTARVY 50 MG-200 MG-25 MG TABLET: 30 days supply | Qty: 30 | Fill #5 | Status: AC

## 2018-05-29 MED FILL — METFORMIN 1,000 MG TABLET: 90 days supply | Qty: 180 | Fill #1 | Status: AC

## 2018-05-29 MED FILL — BIKTARVY 50 MG-200 MG-25 MG TABLET: ORAL | 30 days supply | Qty: 30 | Fill #5

## 2018-05-30 DIAGNOSIS — G629 Polyneuropathy, unspecified: Principal | ICD-10-CM

## 2018-05-30 DIAGNOSIS — G894 Chronic pain syndrome: Principal | ICD-10-CM

## 2018-05-30 MED ORDER — METHADONE 10 MG TABLET
ORAL_TABLET | Freq: Four times a day (QID) | ORAL | 0 refills | 0.00000 days | Status: CP
Start: 2018-05-30 — End: 2018-06-27

## 2018-06-12 MED ORDER — AMITRIPTYLINE 100 MG TABLET
ORAL_TABLET | Freq: Every evening | ORAL | PRN refills | 0.00000 days | Status: CP
Start: 2018-06-12 — End: 2019-06-12
  Filled 2018-06-14: qty 90, 90d supply, fill #0

## 2018-06-12 NOTE — Unmapped (Signed)
Hasn't been seen in our practice since 2017. Sees [facility].

## 2018-06-13 MED ORDER — PEN NEEDLE, DIABETIC 32 GAUGE X 1/6"
PRN refills | 0 days | Status: CP
Start: 2018-06-13 — End: 2019-06-13

## 2018-06-14 MED FILL — NOVOFINE PLUS 32 GAUGE X 1/6" NEEDLE: 90 days supply | Qty: 100 | Fill #0 | Status: AC

## 2018-06-14 MED FILL — AMITRIPTYLINE 100 MG TABLET: 90 days supply | Qty: 90 | Fill #0 | Status: AC

## 2018-06-14 MED FILL — NOVOFINE PLUS 32 GAUGE X 1/6" NEEDLE (4 MM): 90 days supply | Qty: 100 | Fill #0

## 2018-06-16 NOTE — Unmapped (Signed)
I spent 20 minutes on the phone with the patient. I spent an additional 20 minutes on pre- and post-visit activities.     The patient was physically located in [place] or a state in which I am permitted to provide care. The patient understood that s/he may incur co-pays and cost sharing, and agreed to the telemedicine visit. The visit was completed via phone and/or video, which was appropriate and reasonable under the circumstances given the patient's presentation at the time.    The patient has been advised of the potential risks and limitations of this mode of treatment (including, but not limited to, the absence of in-person examination) and has agreed to be treated using telemedicine. The patient's/patient's family's questions regarding telemedicine have been answered.     If the phone/video visit was completed in an ambulatory setting, the patient has also been advised to contact their provider???s office for worsening conditions, and seek emergency medical treatment and/or call 911 if the patient deems either necessary.    ID clinic follow-up visit:  58 yo woman, initially presented HIV+ with Crypto meningitis. Her son was also found to be HIV+ and died at age 12 of AIDS, ca. 26-Sep-2003. Husband x 17 yrs, has always been HIV-negative practicing safe sex. Long term ART, few details, but never failed therapy, HIV RNA suppressed x years. Has been on qd Epizcom and Sustiva for years, despite some chronic HA she attributes to these meds. Was on suppressive Diflucan.    Doing generally well, recently hospitalized for new constrictive Pericarditis, now improved and f/u with Cardiology    Depression: going to counselor at [facility] in [place], doing well  DM:. managed at [facility]   Hypercholesterolemia: pravastatin rx   HTN: now off all anti-HTN with stable BP 05/14/18  GERD: Omeprazole 40 > prn  Chronic pain syndrome and Neuropathy: Dx and duration unclear, but likely 2ndary to diabetes. Has been getting Methadone 10 mg qid x several years and now stable/managable. Carries Dx also distinct peripheral neuropathy as well, cause unclear. Amtriptyline 100 qhs  Bilateral hearing loss: ringing, cause, hx unclear  HSV II: Perirectal, prn ACV. No use recently  Smoking: few cigarettes every few days, says a pack lasts several weeks.  Hysterectomy 1995 >> nl Pap at [facility] 06/23/17    ALLERGIES: vicodoin. tramadol    MEDS: all reviewed with patient  acyclovir (ZOVIRAX) 400 MG Three (3) times a day as needed. Not used recently  amitriptyline (ELAVIL) 100 MG nightly.    metFORMIN (GLUCOPHAGE) 1000 MG bid  methadone (DOLOPHINE) 10 MG tablet qid  Omeprazole (PRILOSEC) 40 MG qd prn, taking less and less  pravastatin (PRAVACHOL) 20 MG qd  Long acting Insulin 40U at night  albuterol HFA 90 mcg/actuation inhaler 2 puff prn   Nightly aspirin (ECOTRIN) 81 MG tablet   bictegrav-emtricit-tenofov ala (BIKTARVY) 50-200-25 mg tablet 1 tablet, Oral, Daily  pioglitazone (ACTOS) 15 MG tablet 15 mg, Oral, Daily   New: Lasix 20 qAM    PHYSICAL EXAMINATION: not done     A/P: Seems quite stable, little has changed  HIV:  Doing well, CD4 = 438, HIV RNA < 40 Apr 11, 2018 on [facility]  DM:  Insulin as per Endo >> provider in [facility]  Chronic pain: Will refill methadone monthly  HTN/CRF Stable on only Lasix 20; Cardiology f/u May 2020.  Lipids:   LDL cholesterol 74 last  HSV  Stable  GERD  Stable using little PPI  HCM:  Mammogram  OK Dec 2016, planned [facility]    Pap nl at [facility] 06/23/17    Colonoscopy in 2013    Wt loss, exercise     Try to get [facility] coverage for dental implants needed    As all labs have been recently done, this can wait till [facility] Sept 2020

## 2018-06-20 ENCOUNTER — Institutional Professional Consult (permissible substitution)
Admit: 2018-06-20 | Discharge: 2018-06-20 | Payer: MEDICARE | Attending: Infectious Disease | Primary: Infectious Disease

## 2018-06-20 ENCOUNTER — Telehealth: Admit: 2018-06-20 | Discharge: 2018-06-20 | Payer: MEDICARE

## 2018-06-20 DIAGNOSIS — Z21 Asymptomatic human immunodeficiency virus [HIV] infection status: Principal | ICD-10-CM

## 2018-06-20 DIAGNOSIS — N183 Chronic kidney disease, stage 3 (moderate): Secondary | ICD-10-CM

## 2018-06-20 DIAGNOSIS — K219 Gastro-esophageal reflux disease without esophagitis: Secondary | ICD-10-CM

## 2018-06-20 DIAGNOSIS — I311 Chronic constrictive pericarditis: Secondary | ICD-10-CM

## 2018-06-20 DIAGNOSIS — G629 Polyneuropathy, unspecified: Secondary | ICD-10-CM

## 2018-06-20 DIAGNOSIS — Z794 Long term (current) use of insulin: Secondary | ICD-10-CM

## 2018-06-20 DIAGNOSIS — E1122 Type 2 diabetes mellitus with diabetic chronic kidney disease: Secondary | ICD-10-CM

## 2018-06-20 NOTE — Unmapped (Signed)
Met w/ pt today in clinic to start RW paperwork. Application is incomplete due to documentation listed below is needed. Pt stated they would mail statement back to me.     1. 2020 SSI lttr    Anna Cantrell  Time Duration of intervention in mins: 5 mins

## 2018-06-27 MED ORDER — METHADONE 10 MG TABLET
ORAL_TABLET | Freq: Four times a day (QID) | ORAL | 0 refills | 0 days | Status: CP
Start: 2018-06-27 — End: 2018-07-24

## 2018-07-03 NOTE — Unmapped (Signed)
Patient called in preparation for phone visit with Dr. Laurice Record    Time spent on visit: 10 min    Allergies reviewed : yes    Pharmacy verified: yes    Medications reviewed: yes    Refills: not needed    Screening completed: yes    Questions/concerns addressed: feeling well has not had lab work done yet may go in AM to [place].      Doximity test:     Verbal General Consent: 3/20

## 2018-07-05 NOTE — Unmapped (Signed)
DIVISION OF CARDIOLOGY  [facility], [place]        Date of Service: 07/04/2018      PCP: Referring Provider:   Conard Novak, MD  [id number]  [place] [address]  Phone: [phone]  Fax: [phone] Anell Barr, MD  [address]  [address]  [id number]  [place], [place] [address]  Phone: [phone]  Fax: [phone]     Assessment and Plan:   1. Constrictive pericarditis  Concern for constrictive physiology on echocardiogram with evidence of mild volume overload at her initial visit. Improved since being started on Lasix 20 mg daily.  - It is most likely that her pericardial calcifications are related to her active smoking history and secondhand tobacco exposure.  We discussed at length about smoking cessation and its importance given this finding.  - We ruled out other causes for pericardial calcifications with labs including an ESR, CRP, ANA, RF and TSH.  - MRI Cardiac shows pericardial calcifications without significant constrictive physiology.    2. Essential hypertension  Well controlled off medications- her Clonidine and Benazepril have bene held since her recent discharge from the hospital.    3. Diabetes mellitus due to underlying condition with hyperosmolarity without coma, with long-term current use of insulin (CMS-HCC)  Lab Results   Component Value Date    A1C 8.2 (H) 12/13/2017   Not optimally controlled on the current regimen of Metformin, Jardiance, glipizide and insulin.    4. CKD (chronic kidney disease) stage 3, GFR 30-59 ml/min (CMS-HCC)  Lab Results   Component Value Date    CREATININE 1.5 (H) 05/24/2018     5. Asymptomatic HIV infection (CMS-HCC)  Lab Results   Component Value Date    CD4 27 (L) 04/11/2018   Viral load has been undetectable for years. Follows with Dr. Carita Pian 12/13/17. She did change medication regimens to [facility] in late 2019, reports good adherence.     6. Mixed hyperlipidemia  Well controled on Pravastatin 20 mg daily.  Lab Results   Component Value Date    CHOL 256 03/18/2013     Lab Results   Component Value Date    HDL 119 03/18/2013     Lab Results   Component Value Date    LDL 67.3 12/07/2016     Lab Results   Component Value Date    TRIG 351 03/18/2013       7. Tobacco use disorder  Active smoker, but also significant exposure to second hand smoke.  It is most likely that her pericardial calcifications are related to her active smoking history and secondhand tobacco exposure.  We discussed at length about smoking cessation and its importance given this finding.    8. Abnormal findings on diagnostic imaging of other specified body structures   Recent CXR showing pericardial calcifications, which may be seen in setting of pericarditis. It is most likely that her pericardial calcifications are related to her active smoking history and second hand tobacco exposure.  We discussed at length about smoking cessation and its importance given this finding.      The 10-year ASCVD risk score Denman George DC Montez Hageman., et al., 2013) is: 21.1%    Values used to calculate the score:      Age: 58 years      Sex: Female      Is Non-Hispanic African American: Yes      Diabetic: Yes  Tobacco smoker: Yes      Systolic Blood Pressure: 128 mmHg      Is BP treated: Yes      HDL Cholesterol: 119 mg/dL      Total Cholesterol: 256 mg/dL    Note: For patients with SBP <90 or >200, Total Cholesterol <130 or >320, HDL <20 or >100 which are outside of the allowable range, the calculator will use these upper or lower values to calculate the patient???s risk score.        Return in about 6 months (around 01/03/2019) for Recheck.      Subjective:        Reason for Consultation: Concern for pericardial constriction    History of Present Illness: [name] is a 58 y.o. female with insulin-dependent diabetes, hypertension, stage III CKD, HIV infection, hyperlipidemia, active tobacco abuse and chronic pain syndrome.  The patient is contacted for scheduled telehealth visit for follow up of pericardial constriction.    She has been doing really well since being started on Lasix. She denies any exertional chest pain.  She denies any palpitations, dizziness or syncope.  She denies any PND or orthopnea. Pedal edema has resolved on Lasix.      Initial history:  The patient was hospitalized in February 2023 [facility] when she originally presented with sepsis, high fevers and leukocytosis.  Her blood cultures at the time grew pansensitive E. coli which was thought to be from a urinary source.  Given her tachycardia and orthostatic symptoms she had an echocardiogram performed which showed evidence of interventricular septal bounce but a collapsible IVC.  Her chest x-ray also showed significant pericardial calcifications and hence she was referred to cardiology for concern for pericardial constriction.    Since her discharge the patient does note some mild worsening of her baseline dyspnea on exertion but she states that her dyspnea has worsened especially when walking uphill. She also endorses worsening pedal edema.  Symptoms are consistent with a mild volume overload state.  Given her pericardial calcifications she had a QuantiFERON gold testing done which was negative for TB.  She continues to be an active smoker and also is exposed to significant secondhand smoke.  She has no plans to quit smoking at this time.  She has no history of radiation to her chest.      Cardiovascular History:  Constrictive physiology on echocardiogram 04/2018    I have independently reviewed of all the diagnostic studies. I have reviewed the old medical records from [facility] prior to this clinic visit.     Cardiovascular Studies Date Comments     ECG 05/14/2018 NSR with 1st degree AV block, rate 93 bpm.  Rightward axis.  Non-specific T wave abnormalities with prolonged QT interval (QTc: 460 msec).   Echo 04/11/2018 ?? Normal left ventricular systolic function, ejection fraction > 55%  ?? Mitral annular calcification  ?? Degenerative mitral valve disease  ?? Normal right ventricular systolic function  ?? Some echocardiographic features suggestive of constrictive physiology    Stress test     Cardiac catheterization     Chest X-ray 04/11/2018 Pericardial calcifications, which may be seen in setting of pericarditis. Recommend correlation with clinical history.  ??  No pulmonary consolidations.   Electrophysiology      Cardiac MRI 05/24/2018 - Basilar pericardial calcifications that abut the right ventricular wall with focal tethering. Mid septal dyskinesis. No septal bounce during inspiration as may be seen as a sign of constrictive pericarditis.   ??  -  Mild right atrial dilatation  ??  - Normal right and left ventricular ejection fractions   Peripheral Vascular Studies     I have personally reviewed the recent imaging studies on this patient.    Past medical history:  Patient Active Problem List   Diagnosis   ??? SOB (shortness of breath)   ??? HIV (human immunodeficiency virus infection) (CMS-HCC)   ??? Abdominal discomfort   ??? Dizziness on standing   ??? Diabetes (CMS-HCC)   ??? Hypertension   ??? Chronic pain   ??? Gram-negative bacteremia   ??? Chronic pain   ??? Gastroesophageal reflux disease   ??? Sensory hearing loss, bilateral   ??? Human immunodeficiency virus (HIV) infection (CMS-HCC)   ??? Hypertension   ??? Neuropathy   ??? Type 2 diabetes mellitus with stage 3 chronic kidney disease, with long-term current use of insulin (CMS-HCC)   ??? Cochlear hearing loss, bilateral   ??? Mixed hyperlipidemia   ??? CKD (chronic kidney disease) stage 3, GFR 30-59 ml/min (CMS-HCC)   ??? Tobacco use disorder   ??? Constrictive pericarditis       Medications:   Patient's Medications   New Prescriptions    No medications on file   Previous Medications    ACYCLOVIR (ZOVIRAX) 400 MG TABLET    Take 400 mg by mouth every eight (8) hours. As needed for outbreaks    ALBUTEROL HFA 90 MCG/ACTUATION INHALER    INHALE 2 PUFFS EVERY 6 HOURS AS NEEDED FOR WHEEZING AMITRIPTYLINE (ELAVIL) 100 MG TABLET    TAKE 1 TABLET BY MOUTH NIGHTLY AS NEEDED FOR SLEEP    ASPIRIN (ECOTRIN) 81 MG TABLET    Take 81 mg by mouth daily.    BICTEGRAV-EMTRICIT-TENOFOV ALA (BIKTARVY) 50-200-25 MG TABLET    Take 1 tablet by mouth daily.    BLOOD SUGAR DIAGNOSTIC STRP    Check every morning before breakfast.    BLOOD-GLUCOSE METER (PRODIGY AUTOCODE METER) KIT    Use as instructed    FUROSEMIDE (LASIX) 20 MG TABLET    Take 1 tablet (20 mg total) by mouth daily.    INSULIN GLARGINE (LANTUS) 100 UNIT/ML INJECTION    Inject 40 Units under the skin nightly.    LANCETS (PRODIGY LANCETS) 28 GAUGE MISC    Use to test blood sugar 3 times daily    METFORMIN (GLUCOPHAGE) 1000 MG TABLET    Take 1,000 mg by mouth 2 (two) times a day with meals.    METHADONE (DOLOPHINE) 10 MG TABLET    Take 1 tablet (10 mg total) by mouth Four (4) times a day.    OMEPRAZOLE (PRILOSEC) 40 MG CAPSULE    Take 40 mg by mouth daily.    PEN NEEDLE, DIABETIC 32 GAUGE X 1/6 NDLE    USE TO INJECT LANTUS NIGHTLY    PIOGLITAZONE (ACTOS) 15 MG TABLET    Take 15 mg by mouth daily.    PRAVASTATIN (PRAVACHOL) 20 MG TABLET    Take 20 mg by mouth daily.     UNIFINE PENTIPS 32 GAUGE X 5/32 NDLE    USE TO INJECT LANTUS NIGHTLY   Modified Medications    No medications on file   Discontinued Medications    No medications on file       Allergies:  Allergies   Allergen Reactions   ??? Hydrocodone-Acetaminophen Hives   ??? Tramadol Hives       Social History:  She  reports that she has been smoking cigarettes. She has  never used smokeless tobacco. She reports previous alcohol use. She reports previous drug use.    Family History:  Her She was adopted. Family history is unknown by patient.    Review of Systems  10 systems were reviewed and negative except as noted in HPI.      Objective:       Physical Exam  There were no vitals taken for this visit.   Wt Readings from Last 3 Encounters:   05/24/18 88.5 kg (195 lb)   05/14/18 86.5 kg (190 lb 11.2 oz) 04/25/18 88.5 kg (195 lb)     Exam deferred since this was a phone visit.    Most recent labs   Lab Results   Component Value Date    Sodium 142 04/25/2018    Potassium 5.0 04/25/2018    Chloride 105 04/25/2018    CO2 25.0 04/25/2018    BUN 16 04/25/2018    Creatinine 1.36 (H) 04/25/2018    Creatinine WB 1.5 (H) 05/24/2018    Magnesium 1.9 04/12/2018     Lab Results   Component Value Date    HGB 10.2 (L) 04/25/2018    MCV 95.3 04/25/2018    Platelet 431 04/25/2018     Lab Results   Component Value Date    Cholesterol 256 03/18/2013    Triglycerides 351 03/18/2013    HDL 119 03/18/2013    LDL Calculated 67 03/18/2013    LDL Direct 67.3 12/07/2016    HGB A1C, POC 12.1 (H) 08/12/2015    Hemoglobin A1C 8.2 (H) 12/13/2017    Hemoglobin A1C 9.5 (A) 05/13/2016    PRO-BNP 818.0 (H) 04/11/2018            *Patient note was created using dragon dictation. Any errors in syntax or proofreading may not have been identified and edited on initial review prior to signing this note.    I spent 10 minutes on the audio/video with the patient. I spent an additional 20 minutes on pre- and post-visit activities.  Video having connection issues, switched to phone visit.    The patient was physically located in [place] or a state in which I am permitted to provide care. The patient and/or parent/gauardian understood that s/he may incur co-pays and cost sharing, and agreed to the telemedicine visit. The visit was completed via phone and/or video, which was appropriate and reasonable under the circumstances given the patient's presentation at the time.    The patient and/or parent/guardian has been advised of the potential risks and limitations of this mode of treatment (including, but not limited to, the absence of in-person examination) and has agreed to be treated using telemedicine. The patient's/patient's family's questions regarding telemedicine have been answered.     If the phone/video visit was completed in an ambulatory setting, the patient and/or parent/guardian has also been advised to contact their provider???s office for worsening conditions, and seek emergency medical treatment and/or call 911 if the patient deems either necessary.

## 2018-07-05 NOTE — Unmapped (Signed)
[facility] Clinical Assessment & Refill Coordination Note    Anna Cantrell, Anna Cantrell: 06/02/1960  Phone: [phone] (home)     All above HIPAA information was verified with patient.     Specialty Medication(s):   Infectious Disease: Biktarvy     Current Outpatient Medications   Medication Sig Dispense Refill   ??? acyclovir (ZOVIRAX) 400 MG tablet Take 400 mg by mouth every eight (8) hours. As needed for outbreaks     ??? albuterol HFA 90 mcg/actuation inhaler INHALE 2 PUFFS EVERY 6 HOURS AS NEEDED FOR WHEEZING 18 g 99   ??? amitriptyline (ELAVIL) 100 MG tablet TAKE 1 TABLET BY MOUTH NIGHTLY AS NEEDED FOR SLEEP 90 tablet PRN   ??? aspirin (ECOTRIN) 81 MG tablet Take 81 mg by mouth daily.     ??? bictegrav-emtricit-tenofov ala (BIKTARVY) 50-200-25 mg tablet Take 1 tablet by mouth daily. 30 tablet 11   ??? blood sugar diagnostic Strp Check every morning before breakfast. 300 each 0   ??? blood-glucose meter (PRODIGY AUTOCODE METER) kit Use as instructed 1 each 0   ??? furosemide (LASIX) 20 MG tablet Take 1 tablet (20 mg total) by mouth daily. 30 tablet 6   ??? insulin glargine (LANTUS) 100 unit/mL injection Inject 40 Units under the skin nightly.     ??? lancets (PRODIGY LANCETS) 28 gauge Misc Use to test blood sugar 3 times daily (Patient not taking: Reported on 05/14/2018) 33 each 3   ??? metFORMIN (GLUCOPHAGE) 1000 MG tablet Take 1,000 mg by mouth 2 (two) times a day with meals.     ??? methadone (DOLOPHINE) 10 MG tablet Take 1 tablet (10 mg total) by mouth Four (4) times a day. 120 tablet 0   ??? omeprazole (PRILOSEC) 40 MG capsule Take 40 mg by mouth daily.     ??? pen needle, diabetic 32 gauge x 1/6 Ndle USE TO INJECT LANTUS NIGHTLY 100 each PRN   ??? pioglitazone (ACTOS) 15 MG tablet Take 15 mg by mouth daily.  11   ??? pravastatin (PRAVACHOL) 20 MG tablet Take 20 mg by mouth daily.      ??? UNIFINE PENTIPS 32 gauge x 5/32 Ndle USE TO INJECT LANTUS NIGHTLY 100 each PRN     No current facility-administered medications for this visit.         Changes to medications: Anna Cantrell reports no changes at this time.    Allergies   Allergen Reactions   ??? Hydrocodone-Acetaminophen Hives   ??? Tramadol Hives       Changes to allergies: No    SPECIALTY MEDICATION ADHERENCE     Biktarvy 50-200-25 mg: 10 days of medicine on hand        Medication Adherence    Patient reported X missed doses in the last month:  0  Specialty Medication:  Biktarvy          Specialty medication(s) dose(s) confirmed: Regimen is correct and unchanged.     Are there any concerns with adherence? No    Adherence counseling provided? Not needed    CLINICAL MANAGEMENT AND INTERVENTION      Clinical Benefit Assessment:    Do you feel the medicine is effective or helping your condition? No    Clinical Benefit counseling provided? Not needed    Adverse Effects Assessment:    Are you experiencing any side effects? No    Are you experiencing difficulty administering your medicine? No    Quality of Life Assessment:  How many days over the past month did your HIV  keep you from your normal activities? For example, brushing your teeth or getting up in the morning. every day - patient did not wish to go into details regarding how she is affected, but she does not like taking a pill every day.    Have you discussed this with your provider? Not needed    Therapy Appropriateness:    Is therapy appropriate? Yes, therapy is appropriate and should be continued    DISEASE/MEDICATION-SPECIFIC INFORMATION      N/A    PATIENT SPECIFIC NEEDS     ? Does the patient have any physical, cognitive, or cultural barriers? No    ? Is the patient high risk? No     ? Does the patient require a Care Management Plan? No     ? Does the patient require physician intervention or other additional services (i.e. nutrition, smoking cessation, social work)? No      SHIPPING     Specialty Medication(s) to be Shipped:   Infectious Disease: Biktarvy    Other medication(s) to be shipped: n/a     Changes to insurance: No    Delivery Scheduled: Yes, Expected medication delivery date: 5/5.     Medication will be delivered via Next Day Courier to the confirmed home address in [facility].    The patient will receive a drug information handout for each medication shipped and additional FDA Medication Guides as required.  Verified that patient has previously received a Conservation officer, historic buildings.    Anna Cantrell   [facility] Specialty Pharmacist

## 2018-07-09 MED FILL — BIKTARVY 50 MG-200 MG-25 MG TABLET: 30 days supply | Qty: 30 | Fill #6 | Status: AC

## 2018-07-09 MED FILL — BIKTARVY 50 MG-200 MG-25 MG TABLET: ORAL | 30 days supply | Qty: 30 | Fill #6

## 2018-07-24 MED ORDER — PRAVASTATIN 20 MG TABLET
ORAL_TABLET | ORAL | 8 refills | 0 days | Status: CP
Start: 2018-07-24 — End: 2019-07-24
  Filled 2018-07-25: qty 90, 90d supply, fill #0

## 2018-07-24 MED ORDER — METHADONE 10 MG TABLET
ORAL_TABLET | Freq: Four times a day (QID) | ORAL | 0 refills | 0 days | Status: CP
Start: 2018-07-24 — End: 2018-08-21

## 2018-07-25 MED FILL — PRAVASTATIN 20 MG TABLET: 90 days supply | Qty: 90 | Fill #0 | Status: AC

## 2018-08-02 NOTE — Unmapped (Signed)
[facility] Refill Coordination Note    Specialty Medication(s) to be Shipped:   Infectious Disease: Biktarvy    Other medication(s) to be shipped: none     Anna Cantrell, DOB: 1961/01/23  Phone: [phone] (home)       All above HIPAA information was verified with patient.     Completed refill call assessment today to schedule patient's medication shipment from the [facility] [phone]).       Specialty medication(s) and dose(s) confirmed: Regimen is correct and unchanged.   Changes to medications: Sayde reports no changes at this time.  Changes to insurance: No  Questions for the pharmacist: No    Confirmed patient received Welcome Packet with first shipment. The patient will receive a drug information handout for each medication shipped and additional FDA Medication Guides as required.       DISEASE/MEDICATION-SPECIFIC INFORMATION        N/A    SPECIALTY MEDICATION ADHERENCE     Medication Adherence    Patient reported X missed doses in the last month:  0  Specialty Medication:  Biktarvy  Patient is on additional specialty medications:  No                Bitarvy 50-200-25 mg: 10 days of medicine on hand          SHIPPING     Shipping address confirmed in Epic.     Delivery Scheduled: Yes, Expected medication delivery date: 08/07/18.     Medication will be delivered via Same Day Courier to the home address in Epic [facility].    Unk Lightning   [facility] Specialty Technician

## 2018-08-03 NOTE — Unmapped (Signed)
Called pt at number located in epic to complete RW renewal paperwork. Due to COVID-19, RW paperwork completed via phone by using self-attestation form. s/w pt. Completed paperwork using self-attestation form. Pt indicated she is not having issues accessing medications at this time.     RW eligible 12/13/2017 to 12/05/2018   IPL=71%, FPL=71%    Avel Peace  Time Duration of intervention in mins: 15 mins

## 2018-08-07 MED FILL — BIKTARVY 50 MG-200 MG-25 MG TABLET: ORAL | 30 days supply | Qty: 30 | Fill #7

## 2018-08-07 MED FILL — BIKTARVY 50 MG-200 MG-25 MG TABLET: 30 days supply | Qty: 30 | Fill #7 | Status: AC

## 2018-08-23 MED ORDER — METHADONE 10 MG TABLET
ORAL_TABLET | Freq: Four times a day (QID) | ORAL | 0 refills | 0 days | Status: CP
Start: 2018-08-23 — End: 2018-09-19

## 2018-09-10 NOTE — Unmapped (Signed)
[facility] Refill Coordination Note    Specialty Medication(s) to be Shipped:   Infectious Disease: Biktarvy    Other medication(s) to be shipped:   Albuterol inhaler  Clonidine   Amitriptyline     Anna Cantrell, DOB: Mar 21, 1960  Phone: [phone] (home)       All above HIPAA information was verified with patient.     Completed refill call assessment today to schedule patient's medication shipment from the [facility] [phone]).       Specialty medication(s) and dose(s) confirmed: Regimen is correct and unchanged.   Changes to medications: Anna Cantrell reports no changes at this time.  Changes to insurance: No  Questions for the pharmacist: No    Confirmed patient received Welcome Packet with first shipment. The patient will receive a drug information handout for each medication shipped and additional FDA Medication Guides as required.       DISEASE/MEDICATION-SPECIFIC INFORMATION        N/A    SPECIALTY MEDICATION ADHERENCE     Medication Adherence    Patient reported X missed doses in the last month:  0            Biktarvy: 8 days on hand      SHIPPING     Shipping address confirmed in Epic.     Delivery Scheduled: Yes, Expected medication delivery date: 09/13/18.     Medication will be delivered via Same Day Courier to the home address in Epic [facility].    Anna Cantrell   [facility] Specialty Technician

## 2018-09-10 NOTE — Unmapped (Signed)
The [facility] has made a third and final attempt to reach this patient to refill the following medication:BIKTARVY.      We have left voicemails on the following phone numbers: [phone] and have been unable to leave messages on the following phone numbers: [phone] .- SPOKE WITH PATIENT'S HUSBAND TWICE AT ENDING IN 3626, AND REPORTS HE WILL HAVE PT CALL BACK IN 10 MINUTES    Dates contacted: 6/24,7/1,7/6  Last scheduled delivery: SHIPPED 6/2    The patient may be at risk of non-compliance with this medication. The patient should call the [facility] at [phone] (option 4) to refill medication.    [name]   [facility] Specialty Technician

## 2018-09-13 MED ORDER — ALBUTEROL SULFATE HFA 90 MCG/ACTUATION AEROSOL INHALER
Freq: Four times a day (QID) | RESPIRATORY_TRACT | PRN refills | 25 days | Status: CP | PRN
Start: 2018-09-13 — End: 2019-09-13
  Filled 2018-09-13: qty 18, 25d supply, fill #0

## 2018-09-13 MED ORDER — CLONIDINE HCL 0.2 MG TABLET
ORAL_TABLET | Freq: Two times a day (BID) | ORAL | 11 refills | 0.00000 days | Status: CP
Start: 2018-09-13 — End: 2018-10-01
  Filled 2018-09-13: qty 60, 30d supply, fill #0

## 2018-09-13 MED FILL — VENTOLIN HFA 90 MCG/ACTUATION AEROSOL INHALER: 25 days supply | Qty: 18 | Fill #0 | Status: AC

## 2018-09-13 MED FILL — BIKTARVY 50 MG-200 MG-25 MG TABLET: 30 days supply | Qty: 30 | Fill #8 | Status: AC

## 2018-09-13 MED FILL — AMITRIPTYLINE 100 MG TABLET: 90 days supply | Qty: 90 | Fill #1 | Status: AC

## 2018-09-13 MED FILL — AMITRIPTYLINE 100 MG TABLET: ORAL | 90 days supply | Qty: 90 | Fill #1

## 2018-09-13 MED FILL — CLONIDINE HCL 0.2 MG TABLET: 30 days supply | Qty: 60 | Fill #0 | Status: AC

## 2018-09-13 MED FILL — BIKTARVY 50 MG-200 MG-25 MG TABLET: ORAL | 30 days supply | Qty: 30 | Fill #8

## 2018-09-13 NOTE — Unmapped (Signed)
Off BP meds and stable at last visit. Who requested refill of catapres?

## 2018-09-19 MED ORDER — METHADONE 10 MG TABLET
ORAL_TABLET | Freq: Four times a day (QID) | ORAL | 0 refills | 30 days | Status: CP
Start: 2018-09-19 — End: 2018-10-15

## 2018-10-01 ENCOUNTER — Telehealth: Admit: 2018-10-01 | Discharge: 2018-10-02 | Payer: MEDICARE

## 2018-10-01 DIAGNOSIS — E782 Mixed hyperlipidemia: Secondary | ICD-10-CM

## 2018-10-01 DIAGNOSIS — F17200 Nicotine dependence, unspecified, uncomplicated: Secondary | ICD-10-CM

## 2018-10-01 DIAGNOSIS — R0602 Shortness of breath: Secondary | ICD-10-CM

## 2018-10-01 DIAGNOSIS — Z794 Long term (current) use of insulin: Secondary | ICD-10-CM

## 2018-10-01 DIAGNOSIS — E1122 Type 2 diabetes mellitus with diabetic chronic kidney disease: Secondary | ICD-10-CM

## 2018-10-01 DIAGNOSIS — N183 Chronic kidney disease, stage 3 (moderate): Secondary | ICD-10-CM

## 2018-10-01 DIAGNOSIS — Z21 Asymptomatic human immunodeficiency virus [HIV] infection status: Secondary | ICD-10-CM

## 2018-10-01 DIAGNOSIS — I311 Chronic constrictive pericarditis: Principal | ICD-10-CM

## 2018-10-01 DIAGNOSIS — I10 Essential (primary) hypertension: Secondary | ICD-10-CM

## 2018-10-01 NOTE — Unmapped (Signed)
Patient called in preparation for phone visit with Dr. Laurice Record    Time spent on visit: 5 MIN    Allergies reviewed : Y    Pharmacy verified: Y    Medications reviewed: Y    Refills: N    Screening completed: Y    Questions/concerns addressed: NONE    Doximity test: Y    Verbal General Consent: Y

## 2018-10-01 NOTE — Unmapped (Signed)
DIVISION OF CARDIOLOGY  [facility]        Date of Service: 10/01/2018      PCP: Referring Provider:   Conard Novak, MD  [id number]  [place] [address]  Phone: [phone]  Fax: [phone] Mickel Baas, MD  [address]  [id number]  [place],  [place] [address]  Phone: [phone]  Fax: [phone]     Assessment and Plan:   1. Constrictive pericarditis  Concern for constrictive physiology on echocardiogram with evidence of mild volume overload at her initial visit. Improved since being started on Lasix 20 mg daily.  - It is most likely that her pericardial calcifications are related to her active smoking history and secondhand tobacco exposure.  We discussed at length about smoking cessation and its importance given this finding.  - We ruled out other causes for pericardial calcifications with labs including an ESR, CRP, ANA, RF and TSH.    2. Essential hypertension  Well controlled off medications- her Clonidine and Benazepril have bene held since her recent discharge from the hospital.    3. Diabetes mellitus due to underlying condition with hyperosmolarity without coma, with long-term current use of insulin (CMS-HCC)  Lab Results   Component Value Date    A1C 8.2 (H) 12/13/2017   Not optimally controlled on the current regimen of Metformin, Jardiance, glipizide and insulin.    4. CKD (chronic kidney disease) stage 3, GFR 30-59 ml/min (CMS-HCC)  Lab Results   Component Value Date    CREATININE 1.5 (H) 05/24/2018     5. Asymptomatic HIV infection (CMS-HCC)  Lab Results   Component Value Date    CD4 27 (L) 04/11/2018   Viral load has been undetectable for years. Follows with Dr. Carita Pian 12/13/17. She did change medication regimens to [facility] in late 2019, reports good adherence.     6. Mixed hyperlipidemia  Well controled on Pravastatin 20 mg daily.  Lab Results   Component Value Date    CHOL 256 03/18/2013     Lab Results   Component Value Date HDL 119 03/18/2013     Lab Results   Component Value Date    LDL 67.3 12/07/2016     Lab Results   Component Value Date    TRIG 351 03/18/2013       7. Tobacco use disorder  Active smoker, but also significant exposure to second hand smoke.  It is most likely that her pericardial calcifications are related to her active smoking history and secondhand tobacco exposure.  We discussed at length about smoking cessation and its importance given this finding.    8. Abnormal findings on diagnostic imaging of other specified body structures   Recent CXR showing pericardial calcifications, which may be seen in setting of pericarditis. It is most likely that her pericardial calcifications are related to her active smoking history and second hand tobacco exposure.  We discussed at length about smoking cessation and its importance given this finding.      The 10-year ASCVD risk score Denman George DC Jr., et al., 2013) is: 21.1%    Values used to calculate the score:      Age: 23 years      Sex: Female      Is Non-Hispanic African American: Yes      Diabetic: Yes      Tobacco smoker: Yes      Systolic Blood Pressure: 128 mmHg  Is BP treated: Yes      HDL Cholesterol: 119 mg/dL      Total Cholesterol: 256 mg/dL    Note: For patients with SBP <90 or >200, Total Cholesterol <130 or >320, HDL <20 or >100 which are outside of the allowable range, the calculator will use these upper or lower values to calculate the patient???s risk score.        Return in about 6 months (around 04/03/2019) for Recheck.      Subjective:        Reason for Consultation: Follow up for pericardial constriction    History of Present Illness: [name] is a 58 y.o. female with insulin-dependent diabetes, hypertension, stage III CKD, HIV infection, hyperlipidemia, active tobacco abuse and chronic pain syndrome.  The patient is contacted for scheduled telehealth visit for follow up of pericardial constriction.    She has been doing really well since being started on Lasix. She denies any exertional chest pain.  She denies any palpitations, dizziness or syncope.  She denies any PND or orthopnea. Pedal edema has resolved on Lasix.      Initial history:  The patient was hospitalized in February 2023 [facility] when she originally presented with sepsis, high fevers and leukocytosis.  Her blood cultures at the time grew pansensitive E. coli which was thought to be from a urinary source.  Given her tachycardia and orthostatic symptoms she had an echocardiogram performed which showed evidence of interventricular septal bounce but a collapsible IVC.  Her chest x-ray also showed significant pericardial calcifications and hence she was referred to cardiology for concern for pericardial constriction.    Since her discharge the patient does note some mild worsening of her baseline dyspnea on exertion but she states that her dyspnea has worsened especially when walking uphill. She also endorses worsening pedal edema.  Symptoms are consistent with a mild volume overload state.  Given her pericardial calcifications she had a QuantiFERON gold testing done which was negative for TB.  She continues to be an active smoker and also is exposed to significant secondhand smoke.  She has no plans to quit smoking at this time.  She has no history of radiation to her chest.      Cardiovascular History:  Constrictive physiology on echocardiogram 04/2018    I have independently reviewed of all the diagnostic studies. I have reviewed the old medical records from [facility] prior to this clinic visit.     Cardiovascular Studies Date Comments     ECG 05/14/2018 NSR with 1st degree AV block, rate 93 bpm.  Rightward axis.  Non-specific T wave abnormalities with prolonged QT interval (QTc: 460 msec).   Echo 04/11/2018 ?? Normal left ventricular systolic function, ejection fraction > 55%  ?? Mitral annular calcification  ?? Degenerative mitral valve disease  ?? Normal right ventricular systolic function  ?? Some echocardiographic features suggestive of constrictive physiology    Stress test     Cardiac catheterization     Chest X-ray 04/11/2018 Pericardial calcifications, which may be seen in setting of pericarditis. Recommend correlation with clinical history.  ??  No pulmonary consolidations.   Electrophysiology      Cardiac MRI 05/24/2018 - Basilar pericardial calcifications that abut the right ventricular wall with focal tethering. Mid septal dyskinesis. No septal bounce during inspiration as may be seen as a sign of constrictive pericarditis.   ??  - Mild right atrial dilatation  ??  - Normal right and left ventricular ejection fractions  Peripheral Vascular Studies     I have personally reviewed the recent imaging studies on this patient.    Past medical history:  Patient Active Problem List   Diagnosis   ??? SOB (shortness of breath)   ??? HIV (human immunodeficiency virus infection) (CMS-HCC)   ??? Abdominal discomfort   ??? Dizziness on standing   ??? Diabetes (CMS-HCC)   ??? Hypertension   ??? Chronic pain   ??? Gram-negative bacteremia   ??? Chronic pain   ??? Gastroesophageal reflux disease   ??? Sensory hearing loss, bilateral   ??? Human immunodeficiency virus (HIV) infection (CMS-HCC)   ??? Hypertension   ??? Neuropathy   ??? Type 2 diabetes mellitus with stage 3 chronic kidney disease, with long-term current use of insulin (CMS-HCC)   ??? Cochlear hearing loss, bilateral   ??? Mixed hyperlipidemia   ??? CKD (chronic kidney disease) stage 3, GFR 30-59 ml/min (CMS-HCC)   ??? Tobacco use disorder   ??? Constrictive pericarditis       Medications:   Patient's Medications   New Prescriptions    No medications on file   Previous Medications    ACYCLOVIR (ZOVIRAX) 400 MG TABLET    Take 400 mg by mouth every eight (8) hours. As needed for outbreaks    ALBUTEROL HFA 90 MCG/ACTUATION INHALER    Inhale 2 puffs every six (6) hours as needed for wheezing    AMITRIPTYLINE (ELAVIL) 100 MG TABLET    TAKE 1 TABLET BY MOUTH NIGHTLY AS NEEDED FOR SLEEP    ASPIRIN (ECOTRIN) 81 MG TABLET Take 81 mg by mouth daily.    BICTEGRAV-EMTRICIT-TENOFOV ALA (BIKTARVY) 50-200-25 MG TABLET    Take 1 tablet by mouth daily.    BLOOD SUGAR DIAGNOSTIC STRP    Check every morning before breakfast.    BLOOD-GLUCOSE METER (PRODIGY AUTOCODE METER) KIT    Use as instructed    FUROSEMIDE (LASIX) 20 MG TABLET    Take 1 tablet (20 mg total) by mouth daily.    INSULIN GLARGINE (LANTUS) 100 UNIT/ML INJECTION    Inject 40 Units under the skin nightly.    LANCETS (PRODIGY LANCETS) 28 GAUGE MISC    Use to test blood sugar 3 times daily    METFORMIN (GLUCOPHAGE) 1000 MG TABLET    Take 1,000 mg by mouth 2 (two) times a day with meals.    METHADONE (DOLOPHINE) 10 MG TABLET    Take 1 tablet (10 mg total) by mouth Four (4) times a day.    OMEPRAZOLE (PRILOSEC) 40 MG CAPSULE    Take 40 mg by mouth daily.    PEN NEEDLE, DIABETIC 32 GAUGE X 1/6 NDLE    USE TO INJECT LANTUS NIGHTLY    PIOGLITAZONE (ACTOS) 15 MG TABLET    Take 15 mg by mouth daily.    PRAVASTATIN (PRAVACHOL) 20 MG TABLET    Take 1 tablet (20mg ) by mouth once daily.    UNIFINE PENTIPS 32 GAUGE X 5/32 NDLE    USE TO INJECT LANTUS NIGHTLY   Modified Medications    No medications on file   Discontinued Medications    CLONIDINE HCL (CATAPRES) 0.2 MG TABLET    Take 1 tablet (0.2 mg total) by mouth two (2) times a day.       Allergies:  Allergies   Allergen Reactions   ??? Hydrocodone-Acetaminophen Hives   ??? Tramadol Hives       Social History:  She  reports that she has been smoking  cigarettes. She has never used smokeless tobacco. She reports previous alcohol use. She reports previous drug use.    Family History:  Her She was adopted. Family history is unknown by patient.    Review of Systems  10 systems were reviewed and negative except as noted in HPI.      Objective:       Physical Exam  There were no vitals taken for this visit.   Wt Readings from Last 3 Encounters:   05/24/18 88.5 kg (195 lb)   05/14/18 86.5 kg (190 lb 11.2 oz)   04/25/18 88.5 kg (195 lb)     Exam deferred since this was a phone visit.    Most recent labs   Lab Results   Component Value Date    Sodium 142 04/25/2018    Potassium 5.0 04/25/2018    Chloride 105 04/25/2018    CO2 25.0 04/25/2018    BUN 16 04/25/2018    Creatinine 1.36 (H) 04/25/2018    Creatinine WB 1.5 (H) 05/24/2018    Magnesium 1.9 04/12/2018     Lab Results   Component Value Date    HGB 10.2 (L) 04/25/2018    MCV 95.3 04/25/2018    Platelet 431 04/25/2018     Lab Results   Component Value Date    Cholesterol 256 03/18/2013    Triglycerides 351 03/18/2013    HDL 119 03/18/2013    LDL Calculated 67 03/18/2013    LDL Direct 67.3 12/07/2016    HGB A1C, POC 12.1 (H) 08/12/2015    Hemoglobin A1C 8.2 (H) 12/13/2017    Hemoglobin A1C 9.5 (A) 05/13/2016    PRO-BNP 818.0 (H) 04/11/2018            *Patient note was created using dragon dictation. Any errors in syntax or proofreading may not have been identified and edited on initial review prior to signing this note.    I spent 20 minutes on the audio/video with the patient. I spent an additional 20 minutes on pre- and post-visit activities.  Video having connection issues, switched to phone visit.    The patient was physically located in [place] or a state in which I am permitted to provide care. The patient and/or parent/gauardian understood that s/he may incur co-pays and cost sharing, and agreed to the telemedicine visit. The visit was completed via phone and/or video, which was appropriate and reasonable under the circumstances given the patient's presentation at the time.    The patient and/or parent/guardian has been advised of the potential risks and limitations of this mode of treatment (including, but not limited to, the absence of in-person examination) and has agreed to be treated using telemedicine. The patient's/patient's family's questions regarding telemedicine have been answered.     If the phone/video visit was completed in an ambulatory setting, the patient and/or parent/guardian has also been advised to contact their provider???s office for worsening conditions, and seek emergency medical treatment and/or call 911 if the patient deems either necessary.

## 2018-10-09 MED ORDER — METFORMIN 1,000 MG TABLET
ORAL | 3 refills | 0 days | Status: CP
Start: 2018-10-09 — End: 2019-10-09
  Filled 2018-10-10: qty 180, 90d supply, fill #0

## 2018-10-09 NOTE — Unmapped (Signed)
[facility] Refill Coordination Note    Specialty Medication(s) to be Shipped:   Infectious Disease: Biktarvy    Other medication(s) to be shipped: metformin     Anna Cantrell, DOB: 04-Nov-1960  Phone: [phone] (home)       All above HIPAA information was verified with patient.     Completed refill call assessment today to schedule patient's medication shipment from the [facility] [phone]).       Specialty medication(s) and dose(s) confirmed: Regimen is correct and unchanged.   Changes to medications: Anna Cantrell reports no changes at this time.  Changes to insurance: No  Questions for the pharmacist: No    Confirmed patient received Welcome Packet with first shipment. The patient will receive a drug information handout for each medication shipped and additional FDA Medication Guides as required.       DISEASE/MEDICATION-SPECIFIC INFORMATION        N/A    SPECIALTY MEDICATION ADHERENCE     Medication Adherence    Patient reported X missed doses in the last month: 0  Specialty Medication: BIKTARVY  Patient is on additional specialty medications: No                Biktarvy 50-200-25 mg: 7 days of medicine on hand          SHIPPING     Shipping address confirmed in Epic.     Delivery Scheduled: Yes, Expected medication delivery date: 10/11/18.     Medication will be delivered via Next Day Courier to the home address in Epic WAM.    Unk Lightning   [facility] Specialty Technician

## 2018-10-10 MED FILL — METFORMIN 1,000 MG TABLET: 90 days supply | Qty: 180 | Fill #0 | Status: AC

## 2018-10-10 MED FILL — BIKTARVY 50 MG-200 MG-25 MG TABLET: 30 days supply | Qty: 30 | Fill #9 | Status: AC

## 2018-10-10 MED FILL — BIKTARVY 50 MG-200 MG-25 MG TABLET: ORAL | 30 days supply | Qty: 30 | Fill #9

## 2018-10-16 MED ORDER — METHADONE 10 MG TABLET
ORAL_TABLET | Freq: Four times a day (QID) | ORAL | 0 refills | 30.00000 days | Status: CP
Start: 2018-10-16 — End: ?

## 2018-11-02 NOTE — Unmapped (Signed)
[facility] Refill Coordination Note    Specialty Medication(s) to be Shipped:   Infectious Disease: Biktarvy    Other medication(s) to be shipped: pen needles  Pravastatin  Albuterol hfa     Anna Cantrell, DOB: [birth year]  Phone: [phone] (home)       All above HIPAA information was verified with patient.     Completed refill call assessment today to schedule patient's medication shipment from the [facility] [phone]).       Specialty medication(s) and dose(s) confirmed: Regimen is correct and unchanged.   Changes to medications: Anna Cantrell reports no changes at this time.  Changes to insurance: No  Questions for the pharmacist: No    Confirmed patient received Welcome Packet with first shipment. The patient will receive a drug information handout for each medication shipped and additional FDA Medication Guides as required.       DISEASE/MEDICATION-SPECIFIC INFORMATION        N/A    SPECIALTY MEDICATION ADHERENCE     Medication Adherence    Patient reported X missed doses in the last month: 0  Specialty Medication: biktarvy          Patient unable to confirm quantity on hand of biktarvy      SHIPPING     Shipping address confirmed in Epic.     Delivery Scheduled: Yes, Expected medication delivery date: 9/2.     Medication will be delivered via Next Day Courier to the home address in Epic WAM.    Anna Cantrell   [facility] Specialty Technician

## 2018-11-06 MED FILL — VENTOLIN HFA 90 MCG/ACTUATION AEROSOL INHALER: 25 days supply | Qty: 18 | Fill #1 | Status: AC

## 2018-11-06 MED FILL — BIKTARVY 50 MG-200 MG-25 MG TABLET: ORAL | 30 days supply | Qty: 30 | Fill #10

## 2018-11-06 MED FILL — NOVOFINE PLUS 32 GAUGE X 1/6" NEEDLE (4 MM): 90 days supply | Qty: 100 | Fill #1

## 2018-11-06 MED FILL — PRAVASTATIN 20 MG TABLET: ORAL | 90 days supply | Qty: 90 | Fill #1

## 2018-11-06 MED FILL — VENTOLIN HFA 90 MCG/ACTUATION AEROSOL INHALER: RESPIRATORY_TRACT | 25 days supply | Qty: 18 | Fill #1

## 2018-11-06 MED FILL — NOVOFINE PLUS 32 GAUGE X 1/6" NEEDLE: 90 days supply | Qty: 100 | Fill #1 | Status: AC

## 2018-11-06 MED FILL — PRAVASTATIN 20 MG TABLET: 90 days supply | Qty: 90 | Fill #1 | Status: AC

## 2018-11-06 MED FILL — BIKTARVY 50 MG-200 MG-25 MG TABLET: 30 days supply | Qty: 30 | Fill #10 | Status: AC

## 2018-11-13 MED ORDER — FUROSEMIDE 20 MG TABLET
ORAL_TABLET | Freq: Every day | ORAL | 3 refills | 90 days | Status: CP
Start: 2018-11-13 — End: 2019-11-13

## 2018-11-13 NOTE — Unmapped (Signed)
The patient is requesting refills for a cardiac medication prescribed by a [facility] cardiologist. The medication has been sent to the pharmacy listed on the patient's medical record.   The following have been sent:   Lasix

## 2018-11-15 DIAGNOSIS — G894 Chronic pain syndrome: Secondary | ICD-10-CM

## 2018-11-15 DIAGNOSIS — G629 Polyneuropathy, unspecified: Secondary | ICD-10-CM

## 2018-11-15 MED ORDER — METHADONE 10 MG TABLET
ORAL_TABLET | Freq: Four times a day (QID) | ORAL | 0 refills | 30 days | Status: CP
Start: 2018-11-15 — End: 2018-12-12

## 2018-11-28 NOTE — Unmapped (Signed)
refill 

## 2018-12-03 DIAGNOSIS — G629 Polyneuropathy, unspecified: Secondary | ICD-10-CM

## 2018-12-03 NOTE — Unmapped (Addendum)
[facility] Clinical Assessment & Refill Coordination Note    Anna Cantrell, [place]: March 15, 1960  Phone: [phone] (home)     All above HIPAA information was verified with patient.     Specialty Medication(s):   Infectious Disease: Biktarvy     Current Outpatient Medications   Medication Sig Dispense Refill   ??? acyclovir (ZOVIRAX) 400 MG tablet Take 400 mg by mouth every eight (8) hours. As needed for outbreaks     ??? albuterol HFA 90 mcg/actuation inhaler Inhale 2 puffs every six (6) hours as needed for wheezing 18 g PRN   ??? amitriptyline (ELAVIL) 100 MG tablet TAKE 1 TABLET BY MOUTH NIGHTLY AS NEEDED FOR SLEEP 90 tablet PRN   ??? aspirin (ECOTRIN) 81 MG tablet Take 81 mg by mouth daily.     ??? bictegrav-emtricit-tenofov ala (BIKTARVY) 50-200-25 mg tablet Take 1 tablet by mouth daily. 30 tablet 11   ??? blood sugar diagnostic Strp Check every morning before breakfast. 300 each 0   ??? blood-glucose meter (PRODIGY AUTOCODE METER) kit Use as instructed 1 each 0   ??? furosemide (LASIX) 20 MG tablet Take 1 tablet (20 mg total) by mouth daily. 90 tablet 3   ??? insulin glargine (LANTUS) 100 unit/mL injection Inject 40 Units under the skin nightly.     ??? lancets (PRODIGY LANCETS) 28 gauge Misc Use to test blood sugar 3 times daily 33 each 3   ??? metFORMIN (GLUCOPHAGE) 1000 MG tablet Take 1,000 mg by mouth 2 (two) times a day with meals.     ??? metFORMIN (GLUCOPHAGE) 1000 MG tablet Take 1 tablet by mouth twice daily with meals 180 each 3   ??? methadone (DOLOPHINE) 10 MG tablet Take 1 tablet (10 mg total) by mouth Four (4) times a day. 120 tablet 0   ??? omeprazole (PRILOSEC) 40 MG capsule Take 40 mg by mouth daily.     ??? pen needle, diabetic 32 gauge x 1/6 Ndle USE TO INJECT LANTUS NIGHTLY 100 each PRN   ??? pioglitazone (ACTOS) 15 MG tablet Take 15 mg by mouth daily.  11   ??? pravastatin (PRAVACHOL) 20 MG tablet Take 1 tablet (20mg ) by mouth once daily. 90 tablet 8   ??? UNIFINE PENTIPS 32 gauge x 5/32 Ndle USE TO INJECT LANTUS NIGHTLY 100 each PRN     No current facility-administered medications for this visit.         Changes to medications: Trace reports no changes at this time.    Allergies   Allergen Reactions   ??? Hydrocodone-Acetaminophen Hives   ??? Tramadol Hives       Changes to allergies: No    SPECIALTY MEDICATION ADHERENCE     Biktarvy   : 7 days of medicine on hand     Medication Adherence    Patient reported X missed doses in the last month: 0  Specialty Medication: Biktarvy  Patient is on additional specialty medications: No  Informant: patient          Specialty medication(s) dose(s) confirmed: Regimen is correct and unchanged.     Are there any concerns with adherence? No    Adherence counseling provided? Not needed    CLINICAL MANAGEMENT AND INTERVENTION      Clinical Benefit Assessment:    Do you feel the medicine is effective or helping your condition? Yes    HIV ASSOCIATED LABS:     Lab Results   Component Value Date/Time  HIVRS Not Detected 04/11/2018 01:09 AM    HIVRS Not Detected 12/13/2017 03:29 PM    HIVRS Not Detected 06/07/2017 02:43 PM    ACD4 438 (L) 04/11/2018 01:09 AM    ACD4 994 12/13/2017 03:29 PM    ACD4 1,594 12/07/2016 03:12 PM       Clinical Benefit counseling provided? Labs from 04/11/2018 show evidence of clinical benefit    Adverse Effects Assessment:    Are you experiencing any side effects? No    Are you experiencing difficulty administering your medicine? No    Quality of Life Assessment:    How many days over the past month did your HIV  keep you from your normal activities? For example, brushing your teeth or getting up in the morning. 0    Have you discussed this with your provider? Not needed    Therapy Appropriateness:    Is therapy appropriate? Yes, therapy is appropriate and should be continued    DISEASE/MEDICATION-SPECIFIC INFORMATION      N/A    PATIENT SPECIFIC NEEDS     ? Does the patient have any physical, cognitive, or cultural barriers? No    ? Is the patient high risk? No     ? Does the patient require a Care Management Plan? No     ? Does the patient require physician intervention or other additional services (i.e. nutrition, smoking cessation, social work)? No      SHIPPING     Specialty Medication(s) to be Shipped:   Infectious Disease: Biktarvy    Other medication(s) to be shipped: amitriptyline     Changes to insurance: No    Delivery Scheduled: Yes, Expected medication delivery date: 12/05/2018.     Medication will be delivered via Next Day Courier to the confirmed home address in [facility].    The patient will receive a drug information handout for each medication shipped and additional FDA Medication Guides as required.  Verified that patient has previously received a Conservation officer, historic buildings.    All of the patient's questions and concerns have been addressed.    [name]   [facility] Specialty Pharmacist

## 2018-12-04 MED FILL — BIKTARVY 50 MG-200 MG-25 MG TABLET: ORAL | 30 days supply | Qty: 30 | Fill #11

## 2018-12-04 MED FILL — AMITRIPTYLINE 100 MG TABLET: ORAL | 90 days supply | Qty: 90 | Fill #2

## 2018-12-04 MED FILL — BIKTARVY 50 MG-200 MG-25 MG TABLET: 30 days supply | Qty: 30 | Fill #11 | Status: AC

## 2018-12-04 MED FILL — AMITRIPTYLINE 100 MG TABLET: 90 days supply | Qty: 90 | Fill #2 | Status: AC

## 2018-12-12 DIAGNOSIS — G629 Polyneuropathy, unspecified: Secondary | ICD-10-CM

## 2018-12-12 DIAGNOSIS — G894 Chronic pain syndrome: Secondary | ICD-10-CM

## 2018-12-12 MED ORDER — METHADONE 10 MG TABLET
ORAL_TABLET | Freq: Four times a day (QID) | ORAL | 0 refills | 30.00000 days | Status: CP
Start: 2018-12-12 — End: ?

## 2018-12-25 NOTE — Unmapped (Signed)
Patient denied medication refills biktarvy. Patient stated that they have over 3 weeks worth of medication on hand. Moving specialty refill call to appropriate date.

## 2018-12-27 NOTE — Unmapped (Signed)
Medication Refill

## 2019-01-02 MED ORDER — LANTUS SOLOSTAR U-100 INSULIN 100 UNIT/ML (3 ML) SUBCUTANEOUS PEN
Freq: Every day | SUBCUTANEOUS | PRN refills | 180 days | Status: CP
Start: 2019-01-02 — End: 2020-01-02
  Filled 2019-01-03: qty 45, 90d supply, fill #0

## 2019-01-02 NOTE — Unmapped (Signed)
Medication Refill on Lantus insulin

## 2019-01-03 MED FILL — LANTUS SOLOSTAR U-100 INSULIN 100 UNIT/ML (3 ML) SUBCUTANEOUS PEN: 90 days supply | Qty: 45 | Fill #0 | Status: AC

## 2019-01-09 DIAGNOSIS — G894 Chronic pain syndrome: Principal | ICD-10-CM

## 2019-01-09 DIAGNOSIS — G629 Polyneuropathy, unspecified: Principal | ICD-10-CM

## 2019-01-11 DIAGNOSIS — Z21 Asymptomatic human immunodeficiency virus [HIV] infection status: Principal | ICD-10-CM

## 2019-01-11 MED ORDER — METHADONE 10 MG TABLET
ORAL_TABLET | Freq: Four times a day (QID) | ORAL | 0 refills | 30 days | Status: CP
Start: 2019-01-11 — End: ?

## 2019-01-13 NOTE — Unmapped (Signed)
Lab orders

## 2019-01-18 NOTE — Unmapped (Signed)
[facility] Refill Coordination Note    Specialty Medication(s) to be Shipped:   Infectious Disease: Biktarvy-faxed dr    Other medication(s) to be shipped:       Anna Cantrell, DOB: September 02, 1960  Phone: [phone] (home)       All above HIPAA information was verified with patient.     Completed refill call assessment today to schedule patient's medication shipment from the [facility] [phone]).       Specialty medication(s) and dose(s) confirmed: Regimen is correct and unchanged.   Changes to medications: Anna Cantrell reports no changes at this time.  Changes to insurance: No  Questions for the pharmacist: No    Confirmed patient received Welcome Packet with first shipment. The patient will receive a drug information handout for each medication shipped and additional FDA Medication Guides as required.       DISEASE/MEDICATION-SPECIFIC INFORMATION        N/A    SPECIALTY MEDICATION ADHERENCE     Medication Adherence    Patient reported X missed doses in the last month: 0  Specialty Medication: Biktarvy 50-200-25mg   Patient is on additional specialty medications: No  Informant: patient                Biktarvy 50-200-25 mg: 5 days of medicine on hand         SHIPPING     Shipping address confirmed in Epic.     Delivery Scheduled: Yes, Expected medication delivery date: 11/17.     Medication will be delivered via Next Day Courier to the prescription address in Epic WAM.    Anna Cantrell   [facility] Specialty Technician

## 2019-01-21 MED FILL — BIKTARVY 50 MG-200 MG-25 MG TABLET: 30 days supply | Qty: 30 | Fill #0 | Status: AC

## 2019-01-21 MED FILL — BIKTARVY 50 MG-200 MG-25 MG TABLET: ORAL | 30 days supply | Qty: 30 | Fill #0

## 2019-02-06 ENCOUNTER — Institutional Professional Consult (permissible substitution): Admit: 2019-02-06 | Discharge: 2019-02-07 | Payer: MEDICARE

## 2019-02-06 DIAGNOSIS — G629 Polyneuropathy, unspecified: Principal | ICD-10-CM

## 2019-02-06 DIAGNOSIS — G894 Chronic pain syndrome: Principal | ICD-10-CM

## 2019-02-06 DIAGNOSIS — Z21 Asymptomatic human immunodeficiency virus [HIV] infection status: Principal | ICD-10-CM

## 2019-02-06 LAB — BASIC METABOLIC PANEL
BUN / CREAT RATIO: 14
CALCIUM: 9.6 mg/dL (ref 8.5–10.2)
CHLORIDE: 103 mmol/L (ref 98–107)
CO2: 26 mmol/L (ref 22.0–30.0)
CREATININE: 1.11 mg/dL — ABNORMAL HIGH (ref 0.60–1.00)
EGFR CKD-EPI NON-AA FEMALE: 54 mL/min/{1.73_m2} — ABNORMAL LOW (ref >=60)
GLUCOSE RANDOM: 219 mg/dL — ABNORMAL HIGH (ref 70–179)
POTASSIUM: 5.1 mmol/L — ABNORMAL HIGH (ref 3.5–5.0)
SODIUM: 137 mmol/L (ref 135–145)

## 2019-02-06 LAB — CBC W/ AUTO DIFF
BASOPHILS ABSOLUTE COUNT: 0.1 10*9/L (ref 0.0–0.1)
BASOPHILS RELATIVE PERCENT: 0.4 %
EOSINOPHILS ABSOLUTE COUNT: 0.2 10*9/L (ref 0.0–0.4)
EOSINOPHILS RELATIVE PERCENT: 1.4 %
HEMATOCRIT: 41.1 % (ref 36.0–46.0)
HEMOGLOBIN: 12.4 g/dL (ref 12.0–16.0)
LARGE UNSTAINED CELLS: 1 % (ref 0–4)
LYMPHOCYTES ABSOLUTE COUNT: 3.5 10*9/L (ref 1.5–5.0)
MEAN CORPUSCULAR HEMOGLOBIN CONC: 30.2 g/dL — ABNORMAL LOW (ref 31.0–37.0)
MEAN CORPUSCULAR HEMOGLOBIN: 28.4 pg (ref 26.0–34.0)
MEAN CORPUSCULAR VOLUME: 94.1 fL (ref 80.0–100.0)
MEAN PLATELET VOLUME: 9 fL (ref 7.0–10.0)
MONOCYTES ABSOLUTE COUNT: 0.4 10*9/L (ref 0.2–0.8)
MONOCYTES RELATIVE PERCENT: 2.6 %
NEUTROPHILS ABSOLUTE COUNT: 9.2 10*9/L — ABNORMAL HIGH (ref 2.0–7.5)
NEUTROPHILS RELATIVE PERCENT: 68.4 %
PLATELET COUNT: 379 10*9/L (ref 150–440)
RED BLOOD CELL COUNT: 4.37 10*12/L (ref 4.00–5.20)
RED CELL DISTRIBUTION WIDTH: 13.4 % (ref 12.0–15.0)

## 2019-02-06 LAB — SMEAR REVIEW

## 2019-02-06 LAB — ALT (SGPT): Alanine aminotransferase:CCnc:Pt:Ser/Plas:Qn:: 24

## 2019-02-06 LAB — BASOPHILS ABSOLUTE COUNT: Basophils:NCnc:Pt:Bld:Qn:Automated count: 0.1

## 2019-02-06 LAB — BILIRUBIN TOTAL: Bilirubin:MCnc:Pt:Ser/Plas:Qn:: 0.5

## 2019-02-06 LAB — POTASSIUM: Potassium:SCnc:Pt:Ser/Plas:Qn:: 5.1 — ABNORMAL HIGH

## 2019-02-06 LAB — BILIRUBIN, TOTAL: BILIRUBIN TOTAL: 0.5 mg/dL (ref 0.0–1.2)

## 2019-02-06 LAB — AST (SGOT): Aspartate aminotransferase:CCnc:Pt:Ser/Plas:Qn:: 26

## 2019-02-06 NOTE — Unmapped (Signed)
Pt came in for Flu shot and Labs.  She also requested to s/w Jonah and Jonah s/w pt. She tolerated the vaccine well.

## 2019-02-07 DIAGNOSIS — M19111 Post-traumatic osteoarthritis, right shoulder: Principal | ICD-10-CM

## 2019-02-07 DIAGNOSIS — M19112 Post-traumatic osteoarthritis, left shoulder: Principal | ICD-10-CM

## 2019-02-07 LAB — LYMPH MARKER COMPLETE, FLOW
ABSOLUTE CD3 CNT: 2690 {cells}/uL (ref 915–3400)
ABSOLUTE CD4 CNT: 1490 {cells}/uL (ref 510–2320)
ABSOLUTE CD8 CNT: 1163 {cells}/uL (ref 180–1520)
CD16/56%NK CELL": 11 % (ref 1–27)
CD19% (B CELLS)": 15 % (ref 7–23)
CD3% (T CELLS)": 74 % (ref 61–86)
CD4:CD8 RATIO: 1.3 (ref 0.9–4.8)
CD8% T SUPPRESR": 32 % (ref 12–38)

## 2019-02-07 LAB — CD19% (B CELLS)": Lab: 15

## 2019-02-07 MED ORDER — METHADONE 10 MG TABLET
ORAL_TABLET | Freq: Four times a day (QID) | ORAL | 0 refills | 30 days | Status: CP
Start: 2019-02-07 — End: ?

## 2019-02-08 LAB — HIV RNA LOG(10): Lab: 0

## 2019-02-08 LAB — HIV RNA, QUANTITATIVE, PCR: HIV RNA QNT RSLT: NOT DETECTED

## 2019-02-14 NOTE — Unmapped (Signed)
[facility] Refill Coordination Note    Specialty Medication(s) to be Shipped:   Infectious Disease: Biktarvy    Other medication(s) to be shipped: albuterol hfa  Metformin  Pen needles  pravastatin     Anna Cantrell, DOB: Feb 08, 1961  Phone: [phone] (home)       All above HIPAA information was verified with patient.     Was a Nurse, learning disability used for this call? No    Completed refill call assessment today to schedule patient's medication shipment from the [facility] [phone]).       Specialty medication(s) and dose(s) confirmed: Regimen is correct and unchanged.   Changes to medications: Melana reports no changes at this time.  Changes to insurance: No  Questions for the pharmacist: No    Confirmed patient received Welcome Packet with first shipment. The patient will receive a drug information handout for each medication shipped and additional FDA Medication Guides as required.       DISEASE/MEDICATION-SPECIFIC INFORMATION        N/A    SPECIALTY MEDICATION ADHERENCE     Medication Adherence    Patient reported X missed doses in the last month: 0  Specialty Medication: biktarvy          Patient unable to confirm quantity on hand.      SHIPPING     Shipping address confirmed in Epic.     Delivery Scheduled: Yes, Expected medication delivery date: 12/16.     Medication will be delivered via Next Day Courier to the prescription address in Epic WAM.    [name]   [facility] Specialty Technician

## 2019-02-19 MED FILL — PRAVASTATIN 20 MG TABLET: 90 days supply | Qty: 90 | Fill #2 | Status: AC

## 2019-02-19 MED FILL — ALBUTEROL SULFATE HFA 90 MCG/ACTUATION AEROSOL INHALER: 25 days supply | Qty: 18 | Fill #2 | Status: AC

## 2019-02-19 MED FILL — BIKTARVY 50 MG-200 MG-25 MG TABLET: 30 days supply | Qty: 30 | Fill #1 | Status: AC

## 2019-02-19 MED FILL — PRAVASTATIN 20 MG TABLET: ORAL | 90 days supply | Qty: 90 | Fill #2

## 2019-02-19 MED FILL — NOVOFINE PLUS 32 GAUGE X 1/6" NEEDLE (4 MM): 90 days supply | Qty: 100 | Fill #2

## 2019-02-19 MED FILL — NOVOFINE PLUS 32 GAUGE X 1/6" NEEDLE (4 MM): 90 days supply | Qty: 100 | Fill #2 | Status: AC

## 2019-02-19 MED FILL — BIKTARVY 50 MG-200 MG-25 MG TABLET: ORAL | 30 days supply | Qty: 30 | Fill #1

## 2019-02-19 MED FILL — METFORMIN 1,000 MG TABLET: ORAL | 90 days supply | Qty: 180 | Fill #1

## 2019-02-19 MED FILL — ALBUTEROL SULFATE HFA 90 MCG/ACTUATION AEROSOL INHALER: RESPIRATORY_TRACT | 25 days supply | Qty: 18 | Fill #2

## 2019-02-19 MED FILL — METFORMIN 1,000 MG TABLET: 90 days supply | Qty: 180 | Fill #1 | Status: AC

## 2019-03-10 ENCOUNTER — Other Ambulatory Visit: Payer: Self-pay | Admitting: Orthopedic Surgery

## 2019-03-13 NOTE — Unmapped (Signed)
[facility] Refill Coordination Note    Specialty Medication(s) to be Shipped:   Infectious Disease: Biktarvy    Other medication(s) to be shipped: amitriptyline 100mg      Charletta Cousin, DOB: 1960-07-17  Phone: [phone] (home)       All above HIPAA information was verified with patient.     Was a Nurse, learning disability used for this call? No    Completed refill call assessment today to schedule patient's medication shipment from the [facility] [phone]).       Specialty medication(s) and dose(s) confirmed: Regimen is correct and unchanged.   Changes to medications: Dashia reports no changes at this time.  Changes to insurance: No  Questions for the pharmacist: No    Confirmed patient received Welcome Packet with first shipment. The patient will receive a drug information handout for each medication shipped and additional FDA Medication Guides as required.       DISEASE/MEDICATION-SPECIFIC INFORMATION        N/A    SPECIALTY MEDICATION ADHERENCE     Medication Adherence    Patient reported X missed doses in the last month: 0  Specialty Medication: Biktarvy 50-200-25mg   Informant: patient                Biktarvy 500-200-25 mg: 10 days of medicine on hand         SHIPPING     Shipping address confirmed in Epic.     Delivery Scheduled: Yes, Expected medication delivery date: 03/15/19.     Medication will be delivered via Next Day Courier to the prescription address in Epic WAM.    Jasper Loser   [facility] Specialty Technician

## 2019-03-14 DIAGNOSIS — G629 Polyneuropathy, unspecified: Principal | ICD-10-CM

## 2019-03-14 DIAGNOSIS — G894 Chronic pain syndrome: Principal | ICD-10-CM

## 2019-03-14 MED ORDER — METHADONE 10 MG TABLET: 10 mg | tablet | Freq: Four times a day (QID) | 0 refills | 30 days | Status: AC

## 2019-03-14 MED ORDER — METHADONE 10 MG TABLET
ORAL_TABLET | Freq: Four times a day (QID) | ORAL | 0 refills | 30.00000 days | Status: CP
Start: 2019-03-14 — End: 2019-03-14

## 2019-03-14 MED FILL — AMITRIPTYLINE 100 MG TABLET: 90 days supply | Qty: 90 | Fill #3 | Status: AC

## 2019-03-14 MED FILL — BIKTARVY 50 MG-200 MG-25 MG TABLET: ORAL | 30 days supply | Qty: 30 | Fill #2

## 2019-03-14 MED FILL — BIKTARVY 50 MG-200 MG-25 MG TABLET: 30 days supply | Qty: 30 | Fill #2 | Status: AC

## 2019-03-14 MED FILL — AMITRIPTYLINE 100 MG TABLET: ORAL | 90 days supply | Qty: 90 | Fill #3

## 2019-03-15 NOTE — Unmapped (Signed)
refill 

## 2019-03-21 ENCOUNTER — Other Ambulatory Visit: Admission: RE | Admit: 2019-03-21 | Payer: Medicare HMO | Source: Ambulatory Visit

## 2019-03-25 ENCOUNTER — Ambulatory Visit: Admit: 2019-03-25 | Payer: Medicare HMO | Admitting: Orthopedic Surgery

## 2019-03-25 SURGERY — ARTHROSCOPY, SHOULDER WITH REPAIR, ROTATOR CUFF, OPEN
Anesthesia: General | Site: Shoulder | Laterality: Left

## 2019-04-03 DIAGNOSIS — G629 Polyneuropathy, unspecified: Principal | ICD-10-CM

## 2019-04-03 DIAGNOSIS — G894 Chronic pain syndrome: Principal | ICD-10-CM

## 2019-04-05 MED ORDER — METHADONE 10 MG TABLET
ORAL_TABLET | Freq: Four times a day (QID) | ORAL | 0 refills | 30 days | Status: CP
Start: 2019-04-05 — End: ?

## 2019-04-22 NOTE — Unmapped (Signed)
[facility] Refill Coordination Note    Specialty Medication(s) to be Shipped:   Infectious Disease: Biktarvy    Other medication(s) to be shipped: lantus solostar  Albuterol hfa     Anna Cantrell, DOB: 1960/09/06  Phone: [phone] (home)       All above HIPAA information was verified with patient.     Was a Nurse, learning disability used for this call? No    Completed refill call assessment today to schedule patient's medication shipment from the [facility] [phone]).       Specialty medication(s) and dose(s) confirmed: Regimen is correct and unchanged.   Changes to medications: Anna Cantrell reports no changes at this time.  Changes to insurance: No  Questions for the pharmacist: No    Confirmed patient received Welcome Packet with first shipment. The patient will receive a drug information handout for each medication shipped and additional FDA Medication Guides as required.       DISEASE/MEDICATION-SPECIFIC INFORMATION        N/A    SPECIALTY MEDICATION ADHERENCE     Medication Adherence    Patient reported X missed doses in the last month: 1  Specialty Medication: biktarvy 50-200-25mg   Patient is on additional specialty medications: No                biktarvy  : 7 days of medicine on hand         SHIPPING     Shipping address confirmed in Epic.     Delivery Scheduled: Yes, Expected medication delivery date: 2/19 per patient request.     Medication will be delivered via Same Day Courier to the prescription address in Epic WAM.    Anna Cantrell   [facility] Specialty Technician

## 2019-04-26 MED FILL — ALBUTEROL SULFATE HFA 90 MCG/ACTUATION AEROSOL INHALER: 25 days supply | Qty: 18 | Fill #3 | Status: AC

## 2019-04-26 MED FILL — BIKTARVY 50 MG-200 MG-25 MG TABLET: 30 days supply | Qty: 30 | Fill #3 | Status: AC

## 2019-04-26 MED FILL — BIKTARVY 50 MG-200 MG-25 MG TABLET: ORAL | 30 days supply | Qty: 30 | Fill #3

## 2019-04-26 MED FILL — LANTUS SOLOSTAR U-100 INSULIN 100 UNIT/ML (3 ML) SUBCUTANEOUS PEN: 90 days supply | Qty: 45 | Fill #1 | Status: AC

## 2019-04-26 MED FILL — LANTUS SOLOSTAR U-100 INSULIN 100 UNIT/ML (3 ML) SUBCUTANEOUS PEN: SUBCUTANEOUS | 90 days supply | Qty: 45 | Fill #1

## 2019-04-26 MED FILL — ALBUTEROL SULFATE HFA 90 MCG/ACTUATION AEROSOL INHALER: RESPIRATORY_TRACT | 25 days supply | Qty: 18 | Fill #3

## 2019-05-08 DIAGNOSIS — G894 Chronic pain syndrome: Principal | ICD-10-CM

## 2019-05-08 DIAGNOSIS — G629 Polyneuropathy, unspecified: Principal | ICD-10-CM

## 2019-05-08 MED ORDER — METHADONE 10 MG TABLET
ORAL_TABLET | Freq: Four times a day (QID) | ORAL | 0 refills | 30.00000 days | Status: CP
Start: 2019-05-08 — End: ?

## 2019-05-20 NOTE — Unmapped (Signed)
[facility] Clinical Assessment & Refill Coordination Note    Anna Cantrell, [place]: [birth year]  Phone: [phone] (home)     All above HIPAA information was verified with patient.     Was a Nurse, learning disability used for this call? No    Specialty Medication(s):   Infectious Disease: Biktarvy     Current Outpatient Medications   Medication Sig Dispense Refill   ??? acyclovir (ZOVIRAX) 400 MG tablet Take 400 mg by mouth every eight (8) hours. As needed for outbreaks     ??? albuterol HFA 90 mcg/actuation inhaler Inhale 2 puffs every six (6) hours as needed for wheezing 18 g PRN   ??? amitriptyline (ELAVIL) 100 MG tablet TAKE 1 TABLET BY MOUTH NIGHTLY AS NEEDED FOR SLEEP 90 tablet PRN   ??? aspirin (ECOTRIN) 81 MG tablet Take 81 mg by mouth daily.     ??? bictegrav-emtricit-tenofov ala (BIKTARVY) 50-200-25 mg tablet Take 1 tablet by mouth daily. 30 tablet 11   ??? blood sugar diagnostic Strp Check every morning before breakfast. 300 each 0   ??? blood-glucose meter (PRODIGY AUTOCODE METER) kit Use as instructed 1 each 0   ??? furosemide (LASIX) 20 MG tablet Take 1 tablet (20 mg total) by mouth daily. 90 tablet 3   ??? insulin glargine (LANTUS SOLOSTAR U-100 INSULIN) 100 unit/mL (3 mL) injection pen Inject 27 untis under the skin nightly. May increase up to 50 units per day as directed by Endocrine Clinic 90 mL PRN   ??? lancets (PRODIGY LANCETS) 28 gauge Misc Use to test blood sugar 3 times daily 33 each 3   ??? metFORMIN (GLUCOPHAGE) 1000 MG tablet Take 1,000 mg by mouth 2 (two) times a day with meals.     ??? metFORMIN (GLUCOPHAGE) 1000 MG tablet Take 1 tablet by mouth twice daily with meals 180 each 3   ??? methadone (DOLOPHINE) 10 MG tablet Take 1 tablet (10 mg total) by mouth Four (4) times a day. 120 tablet 0   ??? omeprazole (PRILOSEC) 40 MG capsule Take 40 mg by mouth daily.     ??? pen needle, diabetic 32 gauge x 1/6 (4 mm) Ndle USE TO INJECT LANTUS NIGHTLY 100 each PRN   ??? pioglitazone (ACTOS) 15 MG tablet Take 15 mg by mouth daily.  11   ??? pravastatin (PRAVACHOL) 20 MG tablet Take 1 tablet (20mg ) by mouth once daily. 90 tablet 8   ??? UNIFINE PENTIPS 32 gauge x 5/32 Ndle USE TO INJECT LANTUS NIGHTLY 100 each PRN     No current facility-administered medications for this visit.         Changes to medications: Anna Cantrell reports no changes at this time.    Allergies   Allergen Reactions   ??? Hydrocodone-Acetaminophen Hives   ??? Tramadol Hives       Changes to allergies: No    SPECIALTY MEDICATION ADHERENCE     Biktarvy   : 7 days of medicine on hand       Medication Adherence    Patient reported X missed doses in the last month: 0  Specialty Medication: Biktarvy  Patient is on additional specialty medications: No  Any gaps in refill history greater than 2 weeks in the last 3 months: no  Demonstrates understanding of importance of adherence: yes  Informant: patient  Provider-estimated medication adherence level: good  Patient is at risk for Non-Adherence: No          Specialty medication(s) dose(s) confirmed: Regimen  is correct and unchanged.     Are there any concerns with adherence? No    Adherence counseling provided? Not needed    CLINICAL MANAGEMENT AND INTERVENTION      Clinical Benefit Assessment:    Do you feel the medicine is effective or helping your condition? Yes    HIV ASSOCIATED LABS:     Lab Results   Component Value Date/Time    HIVRS Not Detected 02/06/2019 01:05 PM    HIVRS Not Detected 04/11/2018 01:09 AM    HIVRS Not Detected 12/13/2017 03:29 PM    ACD4 1,490 02/06/2019 01:05 PM    ACD4 438 (L) 04/11/2018 01:09 AM    ACD4 994 12/13/2017 03:29 PM       Clinical Benefit counseling provided? Labs from 02/06/2019 show evidence of clinical benefit    Adverse Effects Assessment:    Are you experiencing any side effects? No    Are you experiencing difficulty administering your medicine? No    Quality of Life Assessment:    How many days over the past month did your HIV  keep you from your normal activities? For example, brushing your teeth or getting up in the morning. 0    Have you discussed this with your provider? Not needed    Therapy Appropriateness:    Is therapy appropriate? Yes, therapy is appropriate and should be continued    DISEASE/MEDICATION-SPECIFIC INFORMATION      N/A    PATIENT SPECIFIC NEEDS     ? Does the patient have any physical, cognitive, or cultural barriers? No    ? Is the patient high risk? No     ? Does the patient require a Care Management Plan? No     ? Does the patient require physician intervention or other additional services (i.e. nutrition, smoking cessation, social work)? No      SHIPPING     Specialty Medication(s) to be Shipped:   Infectious Disease: Biktarvy    Other medication(s) to be shipped: metformin, albuterol HFA and pravastatin     Changes to insurance: No    Delivery Scheduled: Yes, Expected medication delivery date: 05/22/19.     Medication will be delivered via Next Day Courier to the confirmed prescription address in [facility].    The patient will receive a drug information handout for each medication shipped and additional FDA Medication Guides as required.  Verified that patient has previously received a Conservation officer, historic buildings.    All of the patient's questions and concerns have been addressed.    [name]   [facility] Specialty Pharmacist

## 2019-05-21 MED FILL — METFORMIN 1,000 MG TABLET: ORAL | 90 days supply | Qty: 180 | Fill #2

## 2019-05-21 MED FILL — ALBUTEROL SULFATE HFA 90 MCG/ACTUATION AEROSOL INHALER: 25 days supply | Qty: 18 | Fill #4 | Status: AC

## 2019-05-21 MED FILL — PRAVASTATIN 20 MG TABLET: ORAL | 90 days supply | Qty: 90 | Fill #3

## 2019-05-21 MED FILL — ALBUTEROL SULFATE HFA 90 MCG/ACTUATION AEROSOL INHALER: RESPIRATORY_TRACT | 25 days supply | Qty: 18 | Fill #4

## 2019-05-21 MED FILL — PRAVASTATIN 20 MG TABLET: 90 days supply | Qty: 90 | Fill #3 | Status: AC

## 2019-05-21 MED FILL — METFORMIN 1,000 MG TABLET: 90 days supply | Qty: 180 | Fill #2 | Status: AC

## 2019-05-21 MED FILL — BIKTARVY 50 MG-200 MG-25 MG TABLET: ORAL | 30 days supply | Qty: 30 | Fill #4

## 2019-05-21 MED FILL — BIKTARVY 50 MG-200 MG-25 MG TABLET: 30 days supply | Qty: 30 | Fill #4 | Status: AC

## 2019-05-22 NOTE — Unmapped (Signed)
Social Work Referral Services Note    Duration of Intervention: 5 minutes    SW called pt to inform of vaccine clinic in [place] this weekend. SW registered pt for [facility] location for Saturday 3/20 at 3:45pm. Pt expressed no other immediate concerns for SW intervention at this time.      Lilli Few, CHES  ID Clinic Social Work   Direct: [phone]  Main ID: [phone]

## 2019-05-25 ENCOUNTER — Ambulatory Visit: Payer: Medicaid Other | Attending: Internal Medicine

## 2019-05-25 DIAGNOSIS — Z23 Encounter for immunization: Secondary | ICD-10-CM

## 2019-05-25 NOTE — Progress Notes (Signed)
   Covid-19 Vaccination Clinic  Name:  Jemmie Ledgerwood    MRN: [id number] DOB: [birth year]  05/25/2019  Ms. Goertz was observed post Covid-19 immunization for 15 minutes without incident. She was provided with Vaccine Information Sheet and instruction to access the V-Safe system.   Ms. Giarratano was instructed to call 911 with any severe reactions post vaccine: Marland Kitchen Difficulty breathing  . Swelling of face and throat  . A fast heartbeat  . A bad rash all over body  . Dizziness and weakness   Immunizations Administered    Name Date Dose VIS Date Route   Pfizer COVID-19 Vaccine 05/25/2019  4:03 PM 0.3 mL 02/15/2019 Intramuscular   Manufacturer: [employer], [employer]   Lot: [id number]   NDC: [address]-4

## 2019-06-04 DIAGNOSIS — G894 Chronic pain syndrome: Principal | ICD-10-CM

## 2019-06-04 DIAGNOSIS — G629 Polyneuropathy, unspecified: Principal | ICD-10-CM

## 2019-06-05 MED ORDER — METHADONE 10 MG TABLET
ORAL_TABLET | Freq: Four times a day (QID) | ORAL | 0 refills | 30.00000 days | Status: CP
Start: 2019-06-05 — End: ?

## 2019-06-14 MED ORDER — AMITRIPTYLINE 100 MG TABLET
ORAL_TABLET | Freq: Every evening | ORAL | PRN refills | 90 days | Status: CP
Start: 2019-06-14 — End: 2020-06-13
  Filled 2019-06-19: qty 90, 90d supply, fill #0

## 2019-06-14 NOTE — Unmapped (Signed)
[facility] Refill Coordination Note    Specialty Medication(s) to be Shipped:   Infectious Disease: Biktarvy    Other medication(s) to be shipped: albuterol inh and amitriptyline 100mg  (sent rf request)     Anna Cantrell, DOB: January 10, 1961  Phone: [phone] (home)       All above HIPAA information was verified with patient.     Was a Nurse, learning disability used for this call? No    Completed refill call assessment today to schedule patient's medication shipment from the [facility] [phone]).       Specialty medication(s) and dose(s) confirmed: Regimen is correct and unchanged.   Changes to medications: Anna Cantrell reports no changes at this time.  Changes to insurance: No  Questions for the pharmacist: No    Confirmed patient received Welcome Packet with first shipment. The patient will receive a drug information handout for each medication shipped and additional FDA Medication Guides as required.       DISEASE/MEDICATION-SPECIFIC INFORMATION        N/A    SPECIALTY MEDICATION ADHERENCE     Medication Adherence    Patient reported X missed doses in the last month: 0  Specialty Medication: biktarvy  Patient is on additional specialty medications: No          Biktarvy 50-200-25 mg: 7 days of medicine on hand     SHIPPING     Shipping address confirmed in Epic.     Delivery Scheduled: Yes, Expected medication delivery date: 06/20/2019.     Medication will be delivered via Next Day Courier to the prescription address in Epic WAM.    Oretha Milch   [facility] Specialty Technician

## 2019-06-15 ENCOUNTER — Ambulatory Visit: Payer: Medicare HMO | Attending: Internal Medicine

## 2019-06-15 DIAGNOSIS — Z23 Encounter for immunization: Secondary | ICD-10-CM

## 2019-06-15 NOTE — Progress Notes (Signed)
   Covid-19 Vaccination Clinic  Name:  Brandy Harrell    MRN: [id number] DOB: 03/22/1960  06/15/2019  [name] was observed post Covid-19 immunization for 15 minutes without incident. She was provided with Vaccine Information Sheet and instruction to access the V-Safe system.   [name] was instructed to call 911 with any severe reactions post vaccine: Marland Kitchen Difficulty breathing  . Swelling of face and throat  . A fast heartbeat  . A bad rash all over body  . Dizziness and weakness   Immunizations Administered    Name Date Dose VIS Date Route   Pfizer COVID-19 Vaccine 06/15/2019  3:39 PM 0.3 mL 02/15/2019 Intramuscular   Manufacturer: [employer], [employer]   Lot: [id number]   NDC: [address]-3

## 2019-06-19 MED FILL — ALBUTEROL SULFATE HFA 90 MCG/ACTUATION AEROSOL INHALER: 25 days supply | Qty: 18 | Fill #5 | Status: AC

## 2019-06-19 MED FILL — AMITRIPTYLINE 100 MG TABLET: 90 days supply | Qty: 90 | Fill #0 | Status: AC

## 2019-06-19 MED FILL — BIKTARVY 50 MG-200 MG-25 MG TABLET: 30 days supply | Qty: 30 | Fill #5 | Status: AC

## 2019-06-19 MED FILL — BIKTARVY 50 MG-200 MG-25 MG TABLET: ORAL | 30 days supply | Qty: 30 | Fill #5

## 2019-06-19 MED FILL — ALBUTEROL SULFATE HFA 90 MCG/ACTUATION AEROSOL INHALER: RESPIRATORY_TRACT | 25 days supply | Qty: 18 | Fill #5

## 2019-06-26 DIAGNOSIS — G629 Polyneuropathy, unspecified: Principal | ICD-10-CM

## 2019-06-26 DIAGNOSIS — G894 Chronic pain syndrome: Principal | ICD-10-CM

## 2019-06-26 MED ORDER — METHADONE 10 MG TABLET
ORAL_TABLET | Freq: Four times a day (QID) | ORAL | 0 refills | 30 days | Status: CP
Start: 2019-06-26 — End: ?

## 2019-07-16 NOTE — Unmapped (Signed)
Patient is considered out of care, with no office visit since 06/20/2018.     Bridge Counselor spoke confidentially with patient to schedule appointment for retention. Appointment set for 08/21/2019.  During conversation patient stated that the following were barriers to care:   ??? None    Patient is currently on medications for HIV.      ID Clinic Out of Care Patient - Bridge Counseling Care Plan     Goal:  Patient will have an office visit with provider visit within 180 days of last office visit.     Interventions may include:  ?? Send patient an out of care notification via letter and/or MyChart  ?? Placing phone calls to patient and/or listed contacts   ?? Contacting pharmacies as needed for additional information  ?? Researching other online resources as needed     If patient is reached, the bridge counselor will attempt to:  ?? Discover the reason for missed appointments, no shows, or lack of scheduled follow-up appointment such as transportation, finances, child care, or competing needs  ?? Make appropriate referrals and link patient to clinic nurses, social workers, Artist or other support services in clinic to address concerns or barriers to care  ?? Send In-Basket message to social workers or nurses if case management is needed  ?? Schedule a follow-up appointment at appropriate interval based on assessment  ?? Monitor attendance at the appointment and provide reminder calls if needed  ?? Continue follow-up as indicated     Expected Outcome:  Patient will have an office visit with provider visit within 180 days of last office visit.     **If the patient is not able to be located or retained in HIV care a referral will be placed to the [place] HIV state bridge counselor for follow-up.      Duration of service: 8 minutes    Lunette Stands  Bridge Counselor  [facility] Infectious Disease Clinic

## 2019-07-16 NOTE — Unmapped (Signed)
Patient reports having 26 days of biktarvy. Patient requests a call back in 2 weeks.

## 2019-07-26 DIAGNOSIS — G629 Polyneuropathy, unspecified: Principal | ICD-10-CM

## 2019-07-26 DIAGNOSIS — G894 Chronic pain syndrome: Principal | ICD-10-CM

## 2019-07-26 MED ORDER — METHADONE 10 MG TABLET
ORAL_TABLET | Freq: Four times a day (QID) | ORAL | 0 refills | 30 days
Start: 2019-07-26 — End: ?

## 2019-07-27 MED ORDER — METHADONE 10 MG TABLET
ORAL_TABLET | Freq: Four times a day (QID) | ORAL | 0 refills | 30.00000 days | Status: CP
Start: 2019-07-27 — End: ?

## 2019-08-01 MED ORDER — PRAVASTATIN 20 MG TABLET
ORAL_TABLET | ORAL | 8 refills | 0 days
Start: 2019-08-01 — End: 2020-07-31

## 2019-08-02 MED ORDER — PRAVASTATIN 20 MG TABLET
ORAL_TABLET | ORAL | 8 refills | 0.00000 days | Status: CP
Start: 2019-08-02 — End: 2020-08-01
  Filled 2019-08-14: qty 90, 90d supply, fill #0

## 2019-08-02 NOTE — Unmapped (Signed)
Refill request: Pravastatin. Last visit: 06/20/18. Last labs: 02/06/19.    Appmt scheduled for 08/21/19.

## 2019-08-08 NOTE — Unmapped (Signed)
[facility] Refill Coordination Note    Specialty Medication(s) to be Shipped:   Infectious Disease: Biktarvy    Other medication(s) to be shipped: albuterol inhaler     Anna Cantrell, DOB: 1960/03/09  Phone: [phone] (home)       All above HIPAA information was verified with patient.     Was a Nurse, learning disability used for this call? No    Completed refill call assessment today to schedule patient's medication shipment from the [facility] [phone]).       Specialty medication(s) and dose(s) confirmed: Regimen is correct and unchanged.   Changes to medications: Aleria reports no changes at this time.  Changes to insurance: No  Questions for the pharmacist: No    Confirmed patient received Welcome Packet with first shipment. The patient will receive a drug information handout for each medication shipped and additional FDA Medication Guides as required.       DISEASE/MEDICATION-SPECIFIC INFORMATION        N/A    SPECIALTY MEDICATION ADHERENCE     Medication Adherence    Patient reported X missed doses in the last month: 0  Specialty Medication: biktarvy  Patient is on additional specialty medications: No  Informant: patient                Biktarvy 50-200-25 mg: 10 days of medicine on hand         SHIPPING     Shipping address confirmed in Epic.     Delivery Scheduled: Yes, Expected medication delivery date: 08/13/19.     Medication will be delivered via Next Day Courier to the prescription address in Epic WAM.    Anna Cantrell   [facility] Specialty Technician

## 2019-08-12 MED FILL — ALBUTEROL SULFATE HFA 90 MCG/ACTUATION AEROSOL INHALER: RESPIRATORY_TRACT | 25 days supply | Qty: 18 | Fill #6

## 2019-08-12 MED FILL — BIKTARVY 50 MG-200 MG-25 MG TABLET: 30 days supply | Qty: 30 | Fill #6 | Status: AC

## 2019-08-12 MED FILL — BIKTARVY 50 MG-200 MG-25 MG TABLET: ORAL | 30 days supply | Qty: 30 | Fill #6

## 2019-08-12 MED FILL — ALBUTEROL SULFATE HFA 90 MCG/ACTUATION AEROSOL INHALER: 25 days supply | Qty: 18 | Fill #6 | Status: AC

## 2019-08-14 MED FILL — PRAVASTATIN 20 MG TABLET: 90 days supply | Qty: 90 | Fill #0 | Status: AC

## 2019-08-19 MED ORDER — NOVOFINE PLUS 32 GAUGE X 1/6" NEEDLE (4 MM)
PRN refills | 0.00000 days | Status: CP
Start: 2019-08-19 — End: 2020-08-18

## 2019-08-21 ENCOUNTER — Ambulatory Visit
Admit: 2019-08-21 | Discharge: 2019-08-22 | Payer: MEDICARE | Attending: Infectious Disease | Primary: Infectious Disease

## 2019-08-21 DIAGNOSIS — Z21 Asymptomatic human immunodeficiency virus [HIV] infection status: Principal | ICD-10-CM

## 2019-08-21 DIAGNOSIS — E782 Mixed hyperlipidemia: Principal | ICD-10-CM

## 2019-08-21 DIAGNOSIS — E1165 Type 2 diabetes mellitus with hyperglycemia: Principal | ICD-10-CM

## 2019-08-21 DIAGNOSIS — K219 Gastro-esophageal reflux disease without esophagitis: Principal | ICD-10-CM

## 2019-08-21 DIAGNOSIS — Z1231 Encounter for screening mammogram for malignant neoplasm of breast: Principal | ICD-10-CM

## 2019-08-21 LAB — CBC W/ AUTO DIFF
BASOPHILS ABSOLUTE COUNT: 0.1 10*9/L (ref 0.0–0.1)
BASOPHILS RELATIVE PERCENT: 0.6 %
EOSINOPHILS ABSOLUTE COUNT: 0.2 10*9/L (ref 0.0–0.7)
EOSINOPHILS RELATIVE PERCENT: 2.6 %
HEMATOCRIT: 32.4 % — ABNORMAL LOW (ref 35.0–44.0)
LYMPHOCYTES ABSOLUTE COUNT: 3.4 10*9/L (ref 0.7–4.0)
LYMPHOCYTES RELATIVE PERCENT: 37.2 %
MEAN CORPUSCULAR HEMOGLOBIN CONC: 31.7 g/dL (ref 30.0–36.0)
MEAN CORPUSCULAR HEMOGLOBIN: 27.8 pg (ref 26.0–34.0)
MEAN CORPUSCULAR VOLUME: 87.7 fL (ref 82.0–98.0)
MEAN PLATELET VOLUME: 7.1 fL (ref 7.0–10.0)
MONOCYTES ABSOLUTE COUNT: 0.4 10*9/L (ref 0.1–1.0)
MONOCYTES RELATIVE PERCENT: 4.6 %
NEUTROPHILS ABSOLUTE COUNT: 5.1 10*9/L (ref 1.7–7.7)
NEUTROPHILS RELATIVE PERCENT: 55 %
NUCLEATED RED BLOOD CELLS: 0 /100{WBCs} (ref <=4)
PLATELET COUNT: 299 10*9/L (ref 150–450)
RED CELL DISTRIBUTION WIDTH: 13.6 % (ref 12.0–15.0)
WBC ADJUSTED: 9.2 10*9/L (ref 3.5–10.5)

## 2019-08-21 LAB — BASIC METABOLIC PANEL
ANION GAP: 8 mmol/L (ref 3–11)
BLOOD UREA NITROGEN: 18 mg/dL (ref 9–23)
BUN / CREAT RATIO: 12
CALCIUM: 10.1 mg/dL (ref 8.7–10.4)
CO2: 21.9 mmol/L (ref 20.0–31.0)
EGFR CKD-EPI AA FEMALE: 43 mL/min/{1.73_m2}
EGFR CKD-EPI NON-AA FEMALE: 37 mL/min/{1.73_m2}
GLUCOSE RANDOM: 54 mg/dL — ABNORMAL LOW (ref 70–179)
POTASSIUM: 4.1 mmol/L (ref 3.5–5.1)
SODIUM: 136 mmol/L (ref 135–145)

## 2019-08-21 LAB — BILIRUBIN TOTAL: Bilirubin:MCnc:Pt:Ser/Plas:Qn:: 0.3

## 2019-08-21 LAB — ALT (SGPT): Alanine aminotransferase:CCnc:Pt:Ser/Plas:Qn:: 17

## 2019-08-21 LAB — BLOOD UREA NITROGEN: Urea nitrogen:MCnc:Pt:Ser/Plas:Qn:: 18

## 2019-08-21 LAB — AST (SGOT): Aspartate aminotransferase:CCnc:Pt:Ser/Plas:Qn:: 20

## 2019-08-21 LAB — MEAN CORPUSCULAR VOLUME: Erythrocyte mean corpuscular volume:EntVol:Pt:RBC:Qn:Automated count: 87.7

## 2019-08-21 MED ORDER — METFORMIN 1,000 MG TABLET
ORAL_TABLET | Freq: Two times a day (BID) | ORAL | 3 refills | 90 days | Status: CP
Start: 2019-08-21 — End: ?

## 2019-08-21 MED ORDER — DOVATO 50 MG-300 MG TABLET
ORAL_TABLET | Freq: Every day | ORAL | 11 refills | 0.00000 days | Status: CP
Start: 2019-08-21 — End: ?

## 2019-08-21 MED ORDER — OMEPRAZOLE 40 MG CAPSULE,DELAYED RELEASE
ORAL_CAPSULE | Freq: Every day | ORAL | 0 refills | 90.00000 days | Status: CP
Start: 2019-08-21 — End: ?

## 2019-08-21 MED ORDER — PRAVASTATIN 20 MG TABLET
ORAL_TABLET | ORAL | 8 refills | 0 days | Status: CP
Start: 2019-08-21 — End: 2020-08-20

## 2019-08-21 MED ORDER — FUROSEMIDE 20 MG TABLET
ORAL_TABLET | Freq: Every day | ORAL | 3 refills | 90 days | Status: CP
Start: 2019-08-21 — End: 2020-08-20

## 2019-08-21 MED FILL — NOVOFINE PLUS 32 GAUGE X 1/6" NEEDLE (4 MM): 100 days supply | Qty: 100 | Fill #0

## 2019-08-21 MED FILL — NOVOFINE PLUS 32 GAUGE X 1/6" NEEDLE (4 MM): 100 days supply | Qty: 100 | Fill #0 | Status: AC

## 2019-08-21 NOTE — Unmapped (Signed)
ID clinic follow-up visit:  59 yo woman, initially presented HIV+ with Crypto meningitis. Her son was also found to be HIV+ and died at age 94 of AIDS, ca. 13-Sep-2003. Husband has always been HIV-negative practicing safe sex. Long term ART, few details, but never failed therapy, HIV RNA suppressed x years. Has been on qd Epizcom and Sustiva for years, despite some chronic HA she attributes to these meds.  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 Needs new Endo doctor, FS usually 110  Hypercholesterolemia: pravastatin rx   HTN: now off all anti-HTN with stable BP 05/14/18, then on Lasix 20 after discharge   Today has some increased LE edema, L>R   Increase Lasix to 30   Consider cardiac echo, other f/u   GERD: Omeprazole 40 > prn  Chronic pain syndrome and Neuropathy: Dx and duration unclear, but likely 2ndary to diabetes. Has been getting Methadone 10 mg qid x several years and now stable/managable. Carries Dx also distinct peripheral neuropathy as well, cause unclear. Amtriptyline 100 qhs  Bilateral hearing loss: ringing, cause, hx unclear  HSV II: Perirectal or oral, prn ACV 1-2 per yr. No use recently  Smoking: few cigarettes once in a while.  Hysterectomy 1995 >> nl Pap at [facility] 06/23/17  HCM:  Mammogram OK Dec 2016, needs rescheudle    Pap nl at [facility] 06/23/17    Colonoscopy in September 13, 2011    Wt loss, exercise     Try to get [facility] coverage for dental implants needed    F/u 3 months to check on renal, cardiac function    ALLERGIES: vicodoin. tramadol    MEDS: all reviewed with patient  acyclovir (ZOVIRAX) 400 MG Three (3) times a day as needed. Not used recently  amitriptyline (ELAVIL) 100 MG nightly.    metFORMIN (GLUCOPHAGE) 1000 MG bid  methadone (DOLOPHINE) 10 MG tablet qid  Omeprazole (PRILOSEC) 40 MG qd prn, taking less and less  pravastatin (PRAVACHOL) 20 MG qd  Long acting Insulin 44 U at AM  albuterol HFA 90 mcg/actuation inhaler 2 puff prn   Nightly aspirin (ECOTRIN) 81 MG tablet   bictegrav-emtricit-tenofov ala (BIKTARVY) 50-200-25 mg tablet 1 tablet, Oral, Daily  pioglitazone (ACTOS) 15 MG tablet 15 mg, Oral, Daily   Lasix 20 qAM    PHYSICAL EXAMINATION:  AVSS as above 137/75  GENERAL: NAD, pleasant  HEENT: [place]/AT, PERRLA, EOMI, Orophar cl, dentition good, no oral lesions , fundi normal  Chest: Lungs clear throughout   Cor RRR S1S2   Breasts no masses  Abd soft nontender, no HSM NABS   Gen/Rect nd  Ext  no lesion, nl jts, 1+ LE edema R sl > L   Neuro no abnormailities, CrNN 2-12 intact, Nl strength, sensation distally

## 2019-08-21 NOTE — Unmapped (Signed)
Met w/pt in clinic to complete RW application. Completed application,pt is aware this cannot be processed until supporting documents have been received. Pt requested an email to [email or web address], will send pictures of SSD and proof of residency.    Kaitlyn Abshire  Benefits Coordinator, ID Clinic  Time of intervention: 10 mins

## 2019-08-22 LAB — LYMPH MARKER LIMITED,FLOW
ABSOLUTE CD8 CNT: 1258 {cells}/uL (ref 180–1520)
CD3% (T CELLS)": 76 % (ref 61–86)
CD4% (T HELPER)": 38 % (ref 34–58)
CD4:CD8 RATIO: 1 (ref 0.9–4.8)

## 2019-08-22 LAB — HIV RNA: HIV 1 RNA:NCnc:Pt:Ser/Plas:Qn:Probe.amp.tar: 0

## 2019-08-22 LAB — LDL CHOLESTEROL, DIRECT: LDL CHOLESTEROL DIRECT: 74.2 mg/dL

## 2019-08-22 LAB — CD4% (T HELPER)": Cells.CD3+CD4+/100 cells:NFr:Pt:Bld:Qn:: 38

## 2019-08-22 LAB — HIV RNA, QUANTITATIVE, PCR: HIV RNA QNT RSLT: NOT DETECTED

## 2019-08-22 LAB — LDL CHOLESTEROL DIRECT: Cholesterol.in LDL:MCnc:Pt:Ser/Plas:Qn:Direct assay: 74.2

## 2019-08-25 LAB — ESTIMATED AVERAGE GLUCOSE: Estimated average glucose:MCnc:Pt:Bld:Qn:Estimated from glycated hemoglobin: 180

## 2019-08-28 DIAGNOSIS — G894 Chronic pain syndrome: Principal | ICD-10-CM

## 2019-08-28 DIAGNOSIS — G629 Polyneuropathy, unspecified: Principal | ICD-10-CM

## 2019-08-28 MED ORDER — METHADONE 10 MG TABLET
ORAL_TABLET | Freq: Four times a day (QID) | ORAL | 0 refills | 30.00000 days | Status: CP
Start: 2019-08-28 — End: ?

## 2019-09-11 MED FILL — AMITRIPTYLINE 100 MG TABLET: ORAL | 90 days supply | Qty: 90 | Fill #1

## 2019-09-11 MED FILL — ALBUTEROL SULFATE HFA 90 MCG/ACTUATION AEROSOL INHALER: RESPIRATORY_TRACT | 25 days supply | Qty: 18 | Fill #7

## 2019-09-11 MED FILL — AMITRIPTYLINE 100 MG TABLET: 90 days supply | Qty: 90 | Fill #1 | Status: AC

## 2019-09-11 MED FILL — ALBUTEROL SULFATE HFA 90 MCG/ACTUATION AEROSOL INHALER: 25 days supply | Qty: 18 | Fill #7 | Status: AC

## 2019-09-11 MED FILL — LANTUS SOLOSTAR U-100 INSULIN 100 UNIT/ML (3 ML) SUBCUTANEOUS PEN: SUBCUTANEOUS | 90 days supply | Qty: 45 | Fill #2

## 2019-09-11 MED FILL — LANTUS SOLOSTAR U-100 INSULIN 100 UNIT/ML (3 ML) SUBCUTANEOUS PEN: 90 days supply | Qty: 45 | Fill #2 | Status: AC

## 2019-09-13 NOTE — Unmapped (Signed)
07/09-pt no longer getting specialty medication @ [facility] will need to be dis-enrolled @ this time-CB

## 2019-09-13 NOTE — Unmapped (Signed)
This patient has been disenrolled from the [facility] specialty pharmacy services due to a pharmacy change. The patient is now filling at [id number] - [place], [place] - [address] .   Phone:   [phone] ??Fax:  [phone]     [name]  [facility] Shared [facility] Specialty Pharmacist

## 2019-09-20 DIAGNOSIS — G629 Polyneuropathy, unspecified: Principal | ICD-10-CM

## 2019-09-20 DIAGNOSIS — G894 Chronic pain syndrome: Principal | ICD-10-CM

## 2019-09-20 MED ORDER — METHADONE 10 MG TABLET
ORAL_TABLET | Freq: Four times a day (QID) | ORAL | 0 refills | 30 days
Start: 2019-09-20 — End: ?

## 2019-09-21 MED ORDER — METHADONE 10 MG TABLET
ORAL_TABLET | Freq: Four times a day (QID) | ORAL | 0 refills | 30 days | Status: CP
Start: 2019-09-21 — End: ?

## 2019-09-27 MED ORDER — PIOGLITAZONE 15 MG TABLET
ORAL_TABLET | Freq: Every day | ORAL | 0 refills | 90.00000 days
Start: 2019-09-27 — End: ?

## 2019-09-28 ENCOUNTER — Ambulatory Visit: Admit: 2019-09-28 | Payer: MEDICARE

## 2019-09-28 MED ORDER — PIOGLITAZONE 15 MG TABLET
ORAL_TABLET | Freq: Every day | ORAL | 0 refills | 90.00000 days | Status: CP
Start: 2019-09-28 — End: ?

## 2019-10-16 DIAGNOSIS — G629 Polyneuropathy, unspecified: Principal | ICD-10-CM

## 2019-10-16 DIAGNOSIS — G894 Chronic pain syndrome: Principal | ICD-10-CM

## 2019-10-16 MED ORDER — METHADONE 10 MG TABLET
ORAL_TABLET | Freq: Four times a day (QID) | ORAL | 0 refills | 30 days
Start: 2019-10-16 — End: ?

## 2019-10-18 MED ORDER — PIOGLITAZONE 15 MG TABLET
ORAL_TABLET | 0 refills | 0.00000 days
Start: 2019-10-18 — End: ?

## 2019-10-19 MED ORDER — METHADONE 10 MG TABLET
ORAL_TABLET | Freq: Four times a day (QID) | ORAL | 0 refills | 30.00000 days | Status: CP
Start: 2019-10-19 — End: ?

## 2019-11-06 ENCOUNTER — Ambulatory Visit: Admit: 2019-11-06 | Payer: MEDICARE | Attending: "Endocrinology | Primary: "Endocrinology

## 2019-11-13 DIAGNOSIS — G629 Polyneuropathy, unspecified: Principal | ICD-10-CM

## 2019-11-13 DIAGNOSIS — G894 Chronic pain syndrome: Principal | ICD-10-CM

## 2019-11-13 MED ORDER — METHADONE 10 MG TABLET
ORAL_TABLET | Freq: Four times a day (QID) | ORAL | 0 refills | 30.00000 days | Status: CP
Start: 2019-11-13 — End: ?

## 2019-12-11 DIAGNOSIS — G894 Chronic pain syndrome: Principal | ICD-10-CM

## 2019-12-11 DIAGNOSIS — G629 Polyneuropathy, unspecified: Principal | ICD-10-CM

## 2019-12-11 MED ORDER — METHADONE 10 MG TABLET
ORAL_TABLET | Freq: Four times a day (QID) | ORAL | 0 refills | 30 days
Start: 2019-12-11 — End: ?

## 2019-12-21 ENCOUNTER — Ambulatory Visit (INDEPENDENT_AMBULATORY_CARE_PROVIDER_SITE_OTHER): Payer: Medicare HMO

## 2019-12-21 ENCOUNTER — Other Ambulatory Visit: Payer: Self-pay

## 2019-12-21 ENCOUNTER — Encounter: Payer: Self-pay | Admitting: Emergency Medicine

## 2019-12-21 ENCOUNTER — Ambulatory Visit
Admission: EM | Admit: 2019-12-21 | Discharge: 2019-12-21 | Disposition: A | Discharge status: Home / Self Care | Payer: Medicare HMO

## 2019-12-21 DIAGNOSIS — M545 Low back pain, unspecified: Secondary | ICD-10-CM

## 2019-12-21 DIAGNOSIS — W19XXXA Unspecified fall, initial encounter: Secondary | ICD-10-CM

## 2019-12-21 DIAGNOSIS — M542 Cervicalgia: Secondary | ICD-10-CM

## 2019-12-21 MED ORDER — BACLOFEN 10 MG PO TABS: 10.0000 mg | ORAL_TABLET | Freq: Three times a day (TID) | ORAL | 1 refills | Status: AC

## 2019-12-21 NOTE — ED Provider Notes (Signed)
[facility]    CSN: [id number] Arrival date & time: 12/21/19  1029      History   Chief Complaint Chief Complaint  Patient presents with  . Back Pain    HPI Brandy Harrell is a 59 y.o. female presenting for left-sided neck and lower back pain for the past 2 to 3 weeks.  She says that she slipped and fell onto her back a couple of weeks ago and has had persistent pain that has not improved since.  She denies any radiation pain to the extremities.  Denies any numbness, weakness or tingling.  She admits to increased pain with sitting, standing and walking.  Pain improves somewhat with rest.  She has tried ibuprofen and Tylenol without much relief of the pain.  She denies any head injury and no inciting events led to the fall.  Denies any loss of consciousness.  Denies any worsening of condition, but has not any improvement.  Patient denies any history of major back or neck injury and no surgeries of the back or neck.  Patient has no other complaints or concerns today.  HPI  Past Medical History:  Diagnosis Date  . HIV (human immunodeficiency virus infection) (HCC)   . Hypertension     There are no problems to display for this patient.   Past Surgical History:  Procedure Laterality Date  . TOTAL ABDOMINAL HYSTERECTOMY      OB History   No obstetric history on file.      Home Medications    Prior to Admission medications   Medication Sig Start Date End Date Taking? Authorizing Provider  albuterol (PROVENTIL HFA;VENTOLIN HFA) 108 (90 Base) MCG/ACT inhaler Inhale 2 puffs into the lungs every 6 (six) hours as needed for wheezing or shortness of breath. 04/18/17  Yes Payton Mccallumonty, [place], MD  amitriptyline (ELAVIL) 100 MG tablet Take by mouth. 03/10/14 12/21/19 Yes [provider]  Dolutegravir-lamiVUDine (DOVATO) 50-300 MG TABS Take 1 tablet by mouth daily. 08/21/19  Yes [provider]  glipiZIDE (GLUCOTROL) 10 MG tablet Take 10 mg by mouth daily before  breakfast.   Yes [provider]  LANTUS SOLOSTAR 100 UNIT/ML Solostar Pen  02/06/18  Yes [provider]  metFORMIN (GLUCOPHAGE) 1000 MG tablet Take 1,000 mg by mouth 2 (two) times daily with a meal.   Yes [provider]  methadone (DOLOPHINE) 10 MG tablet  03/13/18  Yes [provider]  omeprazole (PRILOSEC) 40 MG capsule Take by mouth. 10/02/14 12/21/19 Yes [provider]  pioglitazone (ACTOS) 15 MG tablet  03/22/18  Yes [provider]  pravastatin (PRAVACHOL) 20 MG tablet Take by mouth. 03/14/16 12/21/19 Yes [provider]  baclofen (LIORESAL) 10 MG tablet Take 1 tablet (10 mg total) by mouth 3 (three) times daily for 10 days. 12/21/19 12/31/19  Eusebio FriendlyEaves, Collis Thede B, PA-C  benazepril (LOTENSIN) 40 MG tablet  02/06/18   [provider]  benzonatate (TESSALON) 100 MG capsule Take 1 capsule (100 mg total) by mouth 3 (three) times daily as needed. 03/28/18   Tommie Samsook, Jayce G, DO  BIKTARVY 50-200-25 MG TABS tablet  03/05/18   [provider]    Family History Family History  Adopted: Yes    Social History Social History   Tobacco Use  . Smoking status: Current Some Day Smoker    Types: Cigarettes  . Smokeless tobacco: Never Used  Vaping Use  . Vaping Use: Never used  Substance Use Topics  . Alcohol use: No  .  Drug use: No     Allergies   Tramadol and Vicodin [hydrocodone-acetaminophen]   Review of Systems Review of Systems  Constitutional: Negative for fatigue and fever.  Genitourinary: Negative for dysuria, flank pain and hematuria.  Musculoskeletal: Positive for back pain, myalgias, neck pain and neck stiffness. Negative for gait problem and joint swelling.  Skin: Negative for color change, rash and wound.  Neurological: Negative for dizziness, weakness, numbness and headaches.     Physical Exam Triage Vital Signs ED Triage Vitals  Enc Vitals Group     BP 12/21/19 1042 (!) 150/82     Pulse Rate  12/21/19 1042 88     Resp 12/21/19 1042 18     Temp 12/21/19 1042 98.6 F (37 C)     Temp Source 12/21/19 1042 Oral     SpO2 12/21/19 1042 100 %     Weight 12/21/19 1038 195 lb 1.7 oz (88.5 kg)     Height 12/21/19 1038 5' 1.5" (1.562 m)     Head Circumference --      Peak Flow --      Pain Score 12/21/19 1038 8     Pain Loc --      Pain Edu? --      Excl. in GC? --    No data found.  Updated Vital Signs BP (!) 150/82 (BP Location: Right Arm)   Pulse 88   Temp 98.6 F (37 C) (Oral)   Resp 18   Ht 5' 1.5" (1.562 m)   Wt 195 lb 1.7 oz (88.5 kg)   SpO2 100%   BMI 36.27 kg/m        Physical Exam Vitals and nursing note reviewed.  Constitutional:      General: She is not in acute distress.    Appearance: Normal appearance. She is not ill-appearing or toxic-appearing.  HENT:     Head: Normocephalic and atraumatic.  Eyes:     General: No scleral icterus.       Right eye: No discharge.        Left eye: No discharge.     Conjunctiva/sclera: Conjunctivae normal.  Cardiovascular:     Rate and Rhythm: Normal rate and regular rhythm.     Heart sounds: Normal heart sounds.  Pulmonary:     Effort: Pulmonary effort is normal. No respiratory distress.     Breath sounds: Normal breath sounds.  Musculoskeletal:     Cervical back: Neck supple. Tenderness (left paracervical muscles) present. No bony tenderness. Pain with movement present. Normal range of motion.     Thoracic back: Normal.     Lumbar back: Tenderness (left lumbar paraspinal muscles) and bony tenderness (L4-S1) present. Decreased range of motion. Negative right straight leg raise test and negative left straight leg raise test.  Skin:    General: Skin is dry.  Neurological:     General: No focal deficit present.     Mental Status: She is alert and oriented to person, place, and time. Mental status is at baseline.     Motor: No weakness.     Gait: Gait normal.  Psychiatric:        Mood and Affect: Mood normal.          Behavior: Behavior normal.        Thought Content: Thought content normal.      UC Treatments / Results  Labs (all labs ordered are listed, but only abnormal results are displayed) Labs Reviewed - No data to display  EKG   Radiology DG Lumbar Spine Complete  Result Date: 12/21/2019 CLINICAL DATA:  Pain following fall EXAM: LUMBAR SPINE - COMPLETE 4+ VIEW COMPARISON:  None. FINDINGS: Frontal, lateral, spot lumbosacral lateral, and bilateral oblique views were obtained. There is no fracture or spondylolisthesis. Disc spaces appear unremarkable. There is no appreciable facet arthropathy. IMPRESSION: No fracture or spondylolisthesis. No appreciable arthropathic change. Electronically Signed   By: Bretta Bang III M.D.   On: 12/21/2019 11:20    Procedures Procedures (including critical care time)  Medications Ordered in UC Medications - No data to display  Initial Impression / Assessment and Plan / UC Course  I have reviewed the triage vital signs and the nursing notes.  Pertinent labs & imaging results that were available during my care of the patient were reviewed by me and considered in my medical decision making (see chart for details).    Imaging of L-spine obtained since she did have a little bit of spinal tenderness.  Imaging is negative for any acute fractures or spondylolisthesis.  No arthropathic changes noted.  I did review the images.  Discussed results of x-ray with patient.  Advised that her pain does seem all muscular.  Advised her that baclofen should help.  Also advised heat, Tylenol, stretches and low-dose of NSAIDs as needed for pain relief.  Advised that she get better over the next 2 weeks, but if it does not she should follow-up with PCP orthopedics as she may need physical therapy.  ED precautions reviewed.   Final Clinical Impressions(s) / UC Diagnoses   Final diagnoses:  Acute left-sided low back pain without sciatica  Cervicalgia      Discharge Instructions     NECK PAIN: Stressed avoiding painful activities. This can exacerbate your symptoms and make them worse.  May apply heat to the areas of pain for some relief. Use medications as directed. Be aware of which medications make you drowsy and do not drive or operate any kind of heavy machinery while using the medication (ie pain medications or muscle relaxers). F/U with PCP for reexamination or return sooner if condition worsens or does not begin to improve over the next few days.   NECK PAIN RED FLAGS: If symptoms get worse than they are right now, you should come back sooner for re-evaluation. If you have increased numbness/ tingling or notice that the numbness/tingling is affecting the legs or saddle region, go to ER. If you ever lose continence go to ER.      BACK PAIN: Stressed avoiding painful activities . RICE (REST, ICE, COMPRESSION, ELEVATION) guidelines reviewed. May alternate ice and heat. Consider use of muscle rubs, Salonpas patches, etc. Use medications as directed including muscle relaxers if prescribed. Take anti-inflammatory medications as prescribed or OTC NSAIDs/Tylenol.  F/u with PCP in 7-10 days for reexamination, and please feel free to call or return to the urgent care at any time for any questions or concerns you may have and we will be happy to help you!   BACK PAIN RED FLAGS: If the back pain acutely worsens or there are any red flag symptoms such as numbness/tingling, leg weakness, saddle anesthesia, or loss of bowel/bladder control, go immediately to the ER. Follow up with [place] as scheduled or sooner if the pain does not begin to resolve or if it worsens before the follow up      ED Prescriptions    Medication Sig Dispense Auth. Provider   baclofen (LIORESAL) 10 MG tablet Take 1 tablet (  10 mg total) by mouth 3 (three) times daily for 10 days. 30 each Gareth Morgan     PDMP not reviewed this encounter.   Shirlee Latch, PA-C 12/21/19  1134

## 2019-12-21 NOTE — Discharge Instructions (Addendum)
NECK PAIN: Stressed avoiding painful activities. This can exacerbate your symptoms and make them worse.  May apply heat to the areas of pain for some relief. Use medications as directed. Be aware of which medications make you drowsy and do not drive or operate any kind of heavy machinery while using the medication (ie pain medications or muscle relaxers). F/U with PCP for reexamination or return sooner if condition worsens or does not begin to improve over the next few days.  ? ?NECK PAIN RED FLAGS: If symptoms get worse than they are right now, you should come back sooner for re-evaluation. If you have increased numbness/ tingling or notice that the numbness/tingling is affecting the legs or saddle region, go to ER. If you ever lose continence go to ER.     ? ?BACK PAIN: Stressed avoiding painful activities . RICE (REST, ICE, COMPRESSION, ELEVATION) guidelines reviewed. May alternate ice and heat. Consider use of muscle rubs, Salonpas patches, etc. Use medications as directed including muscle relaxers if prescribed. Take anti-inflammatory medications as prescribed or OTC NSAIDs/Tylenol.  F/u with PCP in 7-10 days for reexamination, and please feel free to call or return to the urgent care at any time for any questions or concerns you may have and we will be happy to help you!  ? ?BACK PAIN RED FLAGS: If the back pain acutely worsens or there are any red flag symptoms such as numbness/tingling, leg weakness, saddle anesthesia, or loss of bowel/bladder control, go immediately to the ER. Follow up with us as scheduled or sooner if the pain does not begin to resolve or if it worsens before the follow up   ?

## 2019-12-21 NOTE — ED Triage Notes (Signed)
Pt c/o lower back pain and neck pain. Started about 2 weeks ago after a fall. She has tried tylenol and ibuprofen without relief.

## 2020-01-02 ENCOUNTER — Ambulatory Visit: Admit: 2020-01-02 | Discharge: 2020-01-03 | Payer: MEDICARE | Attending: "Endocrinology | Primary: "Endocrinology

## 2020-01-02 MED ORDER — NOVOFINE PLUS 32 GAUGE X 1/6" NEEDLE (4 MM)
Freq: Every evening | PRN refills | 0 days | Status: CP
Start: 2020-01-02 — End: 2021-01-01

## 2020-01-02 MED ORDER — LANTUS SOLOSTAR U-100 INSULIN 100 UNIT/ML (3 ML) SUBCUTANEOUS PEN
Freq: Every day | SUBCUTANEOUS | 1 refills | 90 days | Status: CP
Start: 2020-01-02 — End: 2021-01-01

## 2020-01-02 MED ORDER — METFORMIN 1,000 MG TABLET
ORAL_TABLET | Freq: Two times a day (BID) | ORAL | 3 refills | 90 days | Status: CP
Start: 2020-01-02 — End: ?

## 2020-01-02 MED ORDER — PIOGLITAZONE 15 MG TABLET
ORAL_TABLET | Freq: Every day | ORAL | 3 refills | 90.00000 days | Status: CP
Start: 2020-01-02 — End: 2021-01-01

## 2020-01-08 DIAGNOSIS — G629 Polyneuropathy, unspecified: Principal | ICD-10-CM

## 2020-01-08 DIAGNOSIS — G894 Chronic pain syndrome: Principal | ICD-10-CM

## 2020-01-09 MED ORDER — METHADONE 10 MG TABLET
ORAL_TABLET | Freq: Four times a day (QID) | ORAL | 0 refills | 30.00000 days | Status: CP
Start: 2020-01-09 — End: 2020-02-06

## 2020-02-06 DIAGNOSIS — G629 Polyneuropathy, unspecified: Principal | ICD-10-CM

## 2020-02-06 DIAGNOSIS — G894 Chronic pain syndrome: Principal | ICD-10-CM

## 2020-02-06 MED ORDER — METHADONE 10 MG TABLET
ORAL_TABLET | Freq: Four times a day (QID) | ORAL | 0 refills | 30.00000 days
Start: 2020-02-06 — End: ?

## 2020-03-04 DIAGNOSIS — G629 Polyneuropathy, unspecified: Principal | ICD-10-CM

## 2020-03-04 DIAGNOSIS — G894 Chronic pain syndrome: Principal | ICD-10-CM

## 2020-03-04 MED ORDER — METHADONE 10 MG TABLET
ORAL_TABLET | Freq: Four times a day (QID) | ORAL | 0 refills | 30 days | Status: CP
Start: 2020-03-04 — End: 2020-04-01

## 2020-04-01 DIAGNOSIS — G894 Chronic pain syndrome: Principal | ICD-10-CM

## 2020-04-01 DIAGNOSIS — G629 Polyneuropathy, unspecified: Principal | ICD-10-CM

## 2020-04-01 MED ORDER — METHADONE 10 MG TABLET
ORAL_TABLET | Freq: Four times a day (QID) | ORAL | 0 refills | 30.00000 days | Status: CP
Start: 2020-04-01 — End: ?

## 2020-05-09 DIAGNOSIS — G894 Chronic pain syndrome: Principal | ICD-10-CM

## 2020-05-09 DIAGNOSIS — G629 Polyneuropathy, unspecified: Principal | ICD-10-CM

## 2020-05-09 MED ORDER — METHADONE 10 MG TABLET
ORAL_TABLET | Freq: Four times a day (QID) | ORAL | 0 refills | 30 days | Status: CP
Start: 2020-05-09 — End: ?

## 2020-06-05 DIAGNOSIS — G629 Polyneuropathy, unspecified: Principal | ICD-10-CM

## 2020-06-05 DIAGNOSIS — G894 Chronic pain syndrome: Principal | ICD-10-CM

## 2020-06-05 MED ORDER — METHADONE 10 MG TABLET
ORAL_TABLET | Freq: Four times a day (QID) | ORAL | 0 refills | 30 days | Status: CP
Start: 2020-06-05 — End: ?

## 2020-06-09 DIAGNOSIS — G894 Chronic pain syndrome: Principal | ICD-10-CM

## 2020-06-09 DIAGNOSIS — G629 Polyneuropathy, unspecified: Principal | ICD-10-CM

## 2020-06-09 MED ORDER — METHADONE 10 MG TABLET
ORAL_TABLET | Freq: Four times a day (QID) | ORAL | 0 refills | 30.00000 days | Status: CP
Start: 2020-06-09 — End: ?

## 2020-06-16 ENCOUNTER — Other Ambulatory Visit: Payer: Self-pay

## 2020-06-16 ENCOUNTER — Encounter: Payer: Self-pay | Admitting: Emergency Medicine

## 2020-06-16 ENCOUNTER — Ambulatory Visit (INDEPENDENT_AMBULATORY_CARE_PROVIDER_SITE_OTHER): Payer: Medicare HMO

## 2020-06-16 ENCOUNTER — Ambulatory Visit
Admission: EM | Admit: 2020-06-16 | Discharge: 2020-06-16 | Disposition: A | Discharge status: Home / Self Care | Payer: Medicare HMO | Attending: Family Medicine | Admitting: Family Medicine

## 2020-06-16 DIAGNOSIS — Z888 Allergy status to other drugs, medicaments and biological substances status: Secondary | ICD-10-CM | POA: Diagnosis not present

## 2020-06-16 DIAGNOSIS — F17210 Nicotine dependence, cigarettes, uncomplicated: Secondary | ICD-10-CM | POA: Diagnosis not present

## 2020-06-16 DIAGNOSIS — E119 Type 2 diabetes mellitus without complications: Secondary | ICD-10-CM | POA: Insufficient documentation

## 2020-06-16 DIAGNOSIS — Z79899 Other long term (current) drug therapy: Secondary | ICD-10-CM | POA: Diagnosis not present

## 2020-06-16 DIAGNOSIS — Z21 Asymptomatic human immunodeficiency virus [HIV] infection status: Secondary | ICD-10-CM | POA: Diagnosis not present

## 2020-06-16 DIAGNOSIS — I10 Essential (primary) hypertension: Secondary | ICD-10-CM | POA: Insufficient documentation

## 2020-06-16 DIAGNOSIS — Z7984 Long term (current) use of oral hypoglycemic drugs: Secondary | ICD-10-CM | POA: Diagnosis not present

## 2020-06-16 DIAGNOSIS — Z885 Allergy status to narcotic agent status: Secondary | ICD-10-CM | POA: Diagnosis not present

## 2020-06-16 DIAGNOSIS — M62830 Muscle spasm of back: Secondary | ICD-10-CM | POA: Insufficient documentation

## 2020-06-16 DIAGNOSIS — R0781 Pleurodynia: Secondary | ICD-10-CM | POA: Diagnosis present

## 2020-06-16 DIAGNOSIS — R1032 Left lower quadrant pain: Secondary | ICD-10-CM | POA: Diagnosis not present

## 2020-06-16 HISTORY — DX: Type 2 diabetes mellitus without complications: E11.9

## 2020-06-16 LAB — URINALYSIS, COMPLETE (UACMP) WITH MICROSCOPIC
Bilirubin Urine: NEGATIVE
Glucose, UA: NEGATIVE mg/dL
Ketones, ur: NEGATIVE mg/dL
Nitrite: NEGATIVE
Protein, ur: NEGATIVE mg/dL
Specific Gravity, Urine: 1.02 (ref 1.005–1.030)
pH: 5 (ref 5.0–8.0)

## 2020-06-16 MED ORDER — KETOROLAC TROMETHAMINE 60 MG/2ML IM SOLN
60.0000 mg | Freq: Once | INTRAMUSCULAR | Status: AC
Start: 2020-06-16 — End: 2020-06-16
  Administered 2020-06-16: 60 mg via INTRAMUSCULAR

## 2020-06-16 MED ORDER — KETOROLAC TROMETHAMINE 10 MG PO TABS: 10.0000 mg | ORAL_TABLET | Freq: Three times a day (TID) | ORAL | 0 refills | Status: AC | PRN

## 2020-06-16 MED ORDER — TIZANIDINE HCL 4 MG PO TABS: 4.0000 mg | ORAL_TABLET | Freq: Three times a day (TID) | ORAL | 0 refills | Status: AC | PRN

## 2020-06-16 NOTE — ED Triage Notes (Addendum)
Patient in today c/o left sided flank pain and back pain x 3 days. Patient has taken OTC Ibuprofen, Tylenol, applied ice and heat without relief. Patient denies urinary symptoms.

## 2020-06-16 NOTE — Discharge Instructions (Signed)
Your x-ray is negative for any pneumonia or rib fractures or other injuries.  No acute findings on your x-ray.  Symptoms consistent with inflammation of the small muscles between your ribs as well as muscle spasms.  You have been given an anti-inflammatory injection for pain relief.  I have sent Toradol to the pharmacy to help with pain relief as well as baclofen.  Continue to apply ice and heat to these areas and you should improve over the next several days to weeks.  For any worsening symptoms, please go to the emergency department.

## 2020-06-16 NOTE — ED Provider Notes (Signed)
[facility]    CSN: [id number] Arrival date & time: 06/16/20  1916      History   Chief Complaint Chief Complaint  Patient presents with  . Flank Pain  . Back Pain    HPI Brandy Harrell is a 60 y.o. female presenting for pain of the left ribs that radiates to the left side of her back x3 days.  Patient denies injury.  She says that the pain is worse when she takes a big breath or touches her ribs.  Pain is also worse whenever she twists/rotates.  She says she has tried over-the-counter ibuprofen, Tylenol and also applied ice and heat without relief.  Patient does take methadone daily.  She says this is for her foot pain.  Presently she denies any pain in her chest, palpitations, dizziness or weakness.  She denies any lower abdominal pain, dysuria, urinary frequency urgency.  No abnormal vaginal discharge/itching or odor.  No hematuria.  Past medical history significant for diabetes, hypertension, and HIV.  No other complaints or concerns.  HPI  Past Medical History:  Diagnosis Date  . Diabetes mellitus without complication (HCC)   . HIV (human immunodeficiency virus infection) (HCC)   . Hypertension     There are no problems to display for this patient.   Past Surgical History:  Procedure Laterality Date  . TOTAL ABDOMINAL HYSTERECTOMY    . TRIGGER FINGER RELEASE      OB History   No obstetric history on file.      Home Medications    Prior to Admission medications   Medication Sig Start Date End Date Taking? Authorizing Provider  albuterol (PROVENTIL HFA;VENTOLIN HFA) 108 (90 Base) MCG/ACT inhaler Inhale 2 puffs into the lungs every 6 (six) hours as needed for wheezing or shortness of breath. 04/18/17  Yes Payton Mccallum, MD  amitriptyline (ELAVIL) 100 MG tablet Take by mouth. 03/10/14 06/16/20 Yes [provider]  benazepril (LOTENSIN) 40 MG tablet  02/06/18  Yes [provider]  Dolutegravir-lamiVUDine (DOVATO) 50-300 MG TABS Take 1  tablet by mouth daily. 08/21/19  Yes [provider]  glipiZIDE (GLUCOTROL) 10 MG tablet Take 10 mg by mouth daily before breakfast.   Yes [provider]  ketorolac (TORADOL) 10 MG tablet Take 1 tablet (10 mg total) by mouth every 8 (eight) hours as needed for up to 5 days. 06/16/20 06/21/20 Yes Shirlee Latch, PA-C  LANTUS SOLOSTAR 100 UNIT/ML Solostar Pen  02/06/18  Yes [provider]  metFORMIN (GLUCOPHAGE) 1000 MG tablet Take 1,000 mg by mouth 2 (two) times daily with a meal.   Yes [provider]  methadone (DOLOPHINE) 10 MG tablet  03/13/18  Yes [provider]  omeprazole (PRILOSEC) 40 MG capsule Take by mouth. 10/02/14 06/16/20 Yes [provider]  pioglitazone (ACTOS) 15 MG tablet  03/22/18  Yes [provider]  pravastatin (PRAVACHOL) 20 MG tablet Take by mouth. 03/14/16 06/16/20 Yes [provider]  tiZANidine (ZANAFLEX) 4 MG tablet Take 1 tablet (4 mg total) by mouth every 8 (eight) hours as needed for up to 7 days for muscle spasms. 06/16/20 06/23/20 Yes Shirlee Latch, PA-C  benzonatate (TESSALON) 100 MG capsule Take 1 capsule (100 mg total) by mouth 3 (three) times daily as needed. 03/28/18   Tommie Sams, DO  BIKTARVY 50-200-25 MG TABS tablet  03/05/18   [provider]    Family History Family History  Adopted: Yes  Family history unknown: Yes  Social History Social History   Tobacco Use  . Smoking status: Current Some Day Smoker    Types: Cigarettes  . Smokeless tobacco: Never Used  . Tobacco comment: 1/2 pack per week  Vaping Use  . Vaping Use: Never used  Substance Use Topics  . Alcohol use: No  . Drug use: No     Allergies   Hydrocodone-acetaminophen and Tramadol   Review of Systems Review of Systems  Constitutional: Negative for chills, fatigue and fever.  Gastrointestinal: Negative for abdominal pain, diarrhea, nausea and vomiting.  Genitourinary: Negative for decreased urine  volume, dysuria, frequency, hematuria, pelvic pain, urgency and vaginal discharge.  Musculoskeletal: Positive for back pain.  Skin: Negative for rash.  Neurological: Negative for weakness and numbness.     Physical Exam Triage Vital Signs ED Triage Vitals  Enc Vitals Group     BP 06/16/20 1933 123/77     Pulse Rate 06/16/20 1933 94     Resp 06/16/20 1933 18     Temp 06/16/20 1933 98.5 F (36.9 C)     Temp Source 06/16/20 1933 Oral     SpO2 06/16/20 1933 98 %     Weight 06/16/20 1934 180 lb (81.6 kg)     Height 06/16/20 1934 5' 1.5" (1.562 m)     Head Circumference --      Peak Flow --      Pain Score 06/16/20 1933 8     Pain Loc --      Pain Edu? --      Excl. in GC? --    No data found.  Updated Vital Signs BP 123/77 (BP Location: Left Arm)   Pulse 94   Temp 98.5 F (36.9 C) (Oral)   Resp 18   Ht 5' 1.5" (1.562 m)   Wt 180 lb (81.6 kg)   SpO2 98%   BMI 33.46 kg/m       Physical Exam Vitals and nursing note reviewed.  Constitutional:      General: She is not in acute distress.    Appearance: Normal appearance. She is not ill-appearing or toxic-appearing.  HENT:     Head: Normocephalic and atraumatic.  Eyes:     General: No scleral icterus.       Right eye: No discharge.        Left eye: No discharge.     Conjunctiva/sclera: Conjunctivae normal.  Cardiovascular:     Rate and Rhythm: Normal rate and regular rhythm.     Heart sounds: Normal heart sounds.  Pulmonary:     Effort: Pulmonary effort is normal. No respiratory distress.     Breath sounds: Normal breath sounds. No wheezing, rhonchi or rales.  Chest:     Chest wall: Tenderness (TTP anterior and lateral left ribs as well as left thoracic muscles. No spinal tenderness) present.  Abdominal:     Palpations: Abdomen is soft.     Tenderness: There is no abdominal tenderness. There is no right CVA tenderness or left CVA tenderness.  Musculoskeletal:     Cervical back: Neck supple.  Skin:    General:  Skin is dry.  Neurological:     General: No focal deficit present.     Mental Status: She is alert. Mental status is at baseline.     Motor: No weakness.     Gait: Gait normal.  Psychiatric:        Mood and Affect: Mood normal.        Behavior: Behavior  normal.        Thought Content: Thought content normal.      UC Treatments / Results  Labs (all labs ordered are listed, but only abnormal results are displayed) Labs Reviewed  URINALYSIS, COMPLETE (UACMP) WITH MICROSCOPIC - Abnormal; Notable for the following components:      Result Value   Hgb urine dipstick SMALL (*)    Leukocytes,Ua TRACE (*)    Bacteria, UA FEW (*)    All other components within normal limits  URINE CULTURE    EKG   Radiology DG Ribs Unilateral W/Chest Left  Result Date: 06/16/2020 CLINICAL DATA:  Left-sided flank and rib pain for 3 days EXAM: LEFT RIBS AND CHEST - 3+ VIEW COMPARISON:  03/28/2018 FINDINGS: Frontal view of the chest as well as frontal and oblique views of the left thoracic cage are obtained. There are no acute displaced fractures. Cardiac silhouette is stable. Pericardial calcifications are again noted. No airspace disease, effusion, or pneumothorax. IMPRESSION: 1. No acute intrathoracic process. 2. No acute displaced rib fractures. 3. 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 Electronically Signed   By: Sharlet SalinaMichael  Brown M.D.   On: 06/16/2020 20:21    Procedures Procedures (including critical care time)  Medications Ordered in UC Medications  ketorolac (TORADOL) injection 60 mg (60 mg Intramuscular Given 06/16/20 2003)    Initial Impression / Assessment and Plan / UC Course  I have reviewed the triage vital signs and the nursing notes.  Pertinent labs & imaging results that were available during my care of the patient were reviewed by me and considered in my medical decision making (see chart for details).   60 year old female presenting for atraumatic  left-sided rib pain and left thoracic back pain.  All vital signs are normal and stable.  She is overall well-appearing.  X-ray of left ribs and chest obtained to assess for possible underlying pneumonia or rib fractures.  X-ray is negative for any acute findings.  I did independently review the images.  Advised patient her symptoms consistent with rib pain and muscle spasms.  Treating at this time with ketorolac.  She has been given an injection of ketorolac in clinic and prescribed the oral medication as well for a few days.  Also prescribed tizanidine and advised supportive care.  Patient already takes methadone daily.  Advised her on return and ED precautions.  Patient knows to follow-up with PCP if not improving over the next week.   Final Clinical Impressions(s) / UC Diagnoses   Final diagnoses:  Rib pain on left side  Back spasm     Discharge Instructions     Your x-ray is negative for any pneumonia or rib fractures or other injuries.  No acute findings on your x-ray.  Symptoms consistent with inflammation of the small muscles between your ribs as well as muscle spasms.  You have been given an anti-inflammatory injection for pain relief.  I have sent Toradol to the pharmacy to help with pain relief as well as baclofen.  Continue to apply ice and heat to these areas and you should improve over the next several days to weeks.  For any worsening symptoms, please go to the emergency department.    ED Prescriptions    Medication Sig Dispense Auth. Provider   ketorolac (TORADOL) 10 MG tablet Take 1 tablet (10 mg total) by mouth every 8 (eight) hours as needed for up to 5 days. 15 tablet Eusebio FriendlyEaves, Zhania Shaheen B, PA-C   tiZANidine (ZANAFLEX) 4 MG  tablet Take 1 tablet (4 mg total) by mouth every 8 (eight) hours as needed for up to 7 days for muscle spasms. 20 tablet Shirlee Latch, PA-C     I have reviewed the PDMP during this encounter.   Shirlee Latch, PA-C 06/19/20 [phone]

## 2020-06-18 LAB — URINE CULTURE: Culture: <10000 — AB

## 2020-06-23 ENCOUNTER — Ambulatory Visit: Admit: 2020-06-23 | Discharge: 2020-06-26 | Payer: MEDICARE

## 2020-06-25 MED ORDER — ALBUTEROL SULFATE HFA 90 MCG/ACTUATION AEROSOL INHALER
Freq: Four times a day (QID) | RESPIRATORY_TRACT | PRN refills | 25 days | PRN
Start: 2020-06-25 — End: 2021-06-25

## 2020-06-25 MED ORDER — AMITRIPTYLINE 100 MG TABLET
ORAL_TABLET | Freq: Every evening | ORAL | 0 refills | 30 days
Start: 2020-06-25 — End: 2020-07-25

## 2020-06-25 MED ORDER — ONDANSETRON 4 MG DISINTEGRATING TABLET
ORAL_TABLET | Freq: Two times a day (BID) | ORAL | 0 refills | 5 days | PRN
Start: 2020-06-25 — End: 2020-07-02

## 2020-06-26 MED ORDER — ALBUTEROL SULFATE HFA 90 MCG/ACTUATION AEROSOL INHALER
Freq: Four times a day (QID) | RESPIRATORY_TRACT | PRN refills | 25.00000 days | PRN
Start: 2020-06-26 — End: 2020-06-26

## 2020-06-26 MED ORDER — ONDANSETRON 4 MG DISINTEGRATING TABLET
ORAL_TABLET | Freq: Two times a day (BID) | ORAL | 0 refills | 5 days | Status: CP | PRN
Start: 2020-06-26 — End: 2020-06-26

## 2020-06-26 MED ORDER — POLYETHYLENE GLYCOL 3350 17 GRAM ORAL POWDER PACKET
PACK | Freq: Two times a day (BID) | ORAL | 0 refills | 30 days | PRN
Start: 2020-06-26 — End: 2020-07-26

## 2020-06-26 MED ORDER — AMITRIPTYLINE 100 MG TABLET
ORAL_TABLET | Freq: Every evening | ORAL | 0 refills | 30.00000 days
Start: 2020-06-26 — End: 2020-07-26

## 2020-07-01 ENCOUNTER — Ambulatory Visit: Admit: 2020-07-01 | Payer: MEDICARE | Attending: Infectious Disease | Primary: Infectious Disease

## 2020-07-09 DIAGNOSIS — G894 Chronic pain syndrome: Principal | ICD-10-CM

## 2020-07-09 DIAGNOSIS — G629 Polyneuropathy, unspecified: Principal | ICD-10-CM

## 2020-07-09 MED ORDER — METHADONE 10 MG TABLET
ORAL_TABLET | Freq: Four times a day (QID) | ORAL | 0 refills | 15 days | Status: CP
Start: 2020-07-09 — End: ?

## 2020-07-22 ENCOUNTER — Ambulatory Visit
Admit: 2020-07-22 | Discharge: 2020-07-23 | Payer: MEDICARE | Attending: Infectious Disease | Primary: Infectious Disease

## 2020-07-22 DIAGNOSIS — K219 Gastro-esophageal reflux disease without esophagitis: Principal | ICD-10-CM

## 2020-07-22 DIAGNOSIS — E782 Mixed hyperlipidemia: Principal | ICD-10-CM

## 2020-07-22 DIAGNOSIS — E1165 Type 2 diabetes mellitus with hyperglycemia: Principal | ICD-10-CM

## 2020-07-22 DIAGNOSIS — J9801 Acute bronchospasm: Principal | ICD-10-CM

## 2020-07-22 DIAGNOSIS — G894 Chronic pain syndrome: Principal | ICD-10-CM

## 2020-07-22 DIAGNOSIS — E119 Type 2 diabetes mellitus without complications: Principal | ICD-10-CM

## 2020-07-22 DIAGNOSIS — Z794 Long term (current) use of insulin: Principal | ICD-10-CM

## 2020-07-22 DIAGNOSIS — N183 Type 2 diabetes mellitus with stage 3 chronic kidney disease, with long-term current use of insulin, unspecified whether stage 3a or 3b CKD (CMS-HCC): Principal | ICD-10-CM

## 2020-07-22 DIAGNOSIS — Z21 Asymptomatic human immunodeficiency virus [HIV] infection status: Principal | ICD-10-CM

## 2020-07-22 DIAGNOSIS — E1122 Type 2 diabetes mellitus with diabetic chronic kidney disease: Principal | ICD-10-CM

## 2020-07-22 DIAGNOSIS — G629 Polyneuropathy, unspecified: Principal | ICD-10-CM

## 2020-07-22 MED ORDER — FUROSEMIDE 20 MG TABLET
ORAL_TABLET | Freq: Every day | ORAL | 3 refills | 90.00000 days | Status: CP
Start: 2020-07-22 — End: 2021-07-22

## 2020-07-22 MED ORDER — BLOOD SUGAR DIAGNOSTIC STRIPS
0 refills | 0.00000 days | Status: CP
Start: 2020-07-22 — End: ?

## 2020-07-22 MED ORDER — PRAVASTATIN 20 MG TABLET
ORAL_TABLET | ORAL | 8 refills | 0.00000 days | Status: CP
Start: 2020-07-22 — End: 2021-07-22

## 2020-07-22 MED ORDER — POLYETHYLENE GLYCOL 3350 17 GRAM ORAL POWDER PACKET
PACK | Freq: Two times a day (BID) | ORAL | 0 refills | 30 days | Status: CP | PRN
Start: 2020-07-22 — End: 2020-08-21

## 2020-07-22 MED ORDER — ACYCLOVIR 400 MG TABLET
ORAL_TABLET | Freq: Three times a day (TID) | ORAL | 4 refills | 7.00000 days | Status: CP | PRN
Start: 2020-07-22 — End: ?

## 2020-07-22 MED ORDER — DOVATO 50 MG-300 MG TABLET
ORAL_TABLET | Freq: Every evening | ORAL | 8 refills | 0 days | Status: CP
Start: 2020-07-22 — End: ?

## 2020-07-22 MED ORDER — ASPIRIN 81 MG TABLET,DELAYED RELEASE
ORAL_TABLET | Freq: Every day | ORAL | 3 refills | 90 days | Status: CP
Start: 2020-07-22 — End: ?

## 2020-07-22 MED ORDER — LANCETS 28 GAUGE
3 refills | 0 days | Status: CP
Start: 2020-07-22 — End: ?

## 2020-07-22 MED ORDER — PEN NEEDLE, DIABETIC 32 GAUGE X 5/32" (4 MM)
PRN refills | 0 days | Status: CP
Start: 2020-07-22 — End: ?

## 2020-07-22 MED ORDER — METHADONE 10 MG TABLET
ORAL_TABLET | Freq: Four times a day (QID) | ORAL | 0 refills | 15 days | Status: CP
Start: 2020-07-22 — End: ?

## 2020-07-22 MED ORDER — NOVOFINE PLUS 32 GAUGE X 1/6" NEEDLE (4 MM)
Freq: Every evening | PRN refills | 0.00000 days | Status: CP
Start: 2020-07-22 — End: 2021-07-22

## 2020-07-22 MED ORDER — PIOGLITAZONE 15 MG TABLET
ORAL_TABLET | Freq: Every day | ORAL | 3 refills | 90 days | Status: CP
Start: 2020-07-22 — End: 2021-07-22

## 2020-07-22 MED ORDER — METFORMIN 1,000 MG TABLET
ORAL_TABLET | Freq: Two times a day (BID) | ORAL | 3 refills | 90.00000 days | Status: CP
Start: 2020-07-22 — End: ?

## 2020-07-22 MED ORDER — ALBUTEROL SULFATE HFA 90 MCG/ACTUATION AEROSOL INHALER
Freq: Four times a day (QID) | RESPIRATORY_TRACT | PRN refills | 25 days | PRN
Start: 2020-07-22 — End: 2021-07-22

## 2020-07-22 MED ORDER — LANTUS SOLOSTAR U-100 INSULIN 100 UNIT/ML (3 ML) SUBCUTANEOUS PEN
Freq: Every day | SUBCUTANEOUS | 11 refills | 30.00000 days | Status: CP
Start: 2020-07-22 — End: 2021-07-22

## 2020-07-22 MED ORDER — AMITRIPTYLINE 100 MG TABLET
ORAL_TABLET | Freq: Every evening | ORAL | 0 refills | 30.00000 days
Start: 2020-07-22 — End: 2020-08-21

## 2020-07-22 MED ORDER — OMEPRAZOLE 40 MG CAPSULE,DELAYED RELEASE
ORAL_CAPSULE | Freq: Every day | ORAL | 8 refills | 30 days | Status: CP | PRN
Start: 2020-07-22 — End: ?

## 2020-08-05 DIAGNOSIS — G629 Polyneuropathy, unspecified: Principal | ICD-10-CM

## 2020-08-05 MED ORDER — AMITRIPTYLINE 100 MG TABLET
ORAL_TABLET | Freq: Every evening | ORAL | 0 refills | 30.00000 days | Status: CP
Start: 2020-08-05 — End: 2020-08-05

## 2020-08-10 DIAGNOSIS — J9801 Acute bronchospasm: Principal | ICD-10-CM

## 2020-08-10 DIAGNOSIS — G629 Polyneuropathy, unspecified: Principal | ICD-10-CM

## 2020-08-10 DIAGNOSIS — G894 Chronic pain syndrome: Principal | ICD-10-CM

## 2020-08-10 MED ORDER — ALBUTEROL SULFATE HFA 90 MCG/ACTUATION AEROSOL INHALER
Freq: Four times a day (QID) | RESPIRATORY_TRACT | PRN refills | 25.00000 days | PRN
Start: 2020-08-10 — End: 2021-08-10

## 2020-08-10 MED ORDER — METHADONE 10 MG TABLET
ORAL_TABLET | Freq: Four times a day (QID) | ORAL | 0 refills | 30 days
Start: 2020-08-10 — End: ?

## 2020-08-11 MED ORDER — METHADONE 10 MG TABLET
ORAL_TABLET | Freq: Four times a day (QID) | ORAL | 0 refills | 30 days | Status: CP
Start: 2020-08-11 — End: ?

## 2020-08-29 DIAGNOSIS — G629 Polyneuropathy, unspecified: Principal | ICD-10-CM

## 2020-08-29 MED ORDER — AMITRIPTYLINE 100 MG TABLET
ORAL_TABLET | 2 refills | 0 days
Start: 2020-08-29 — End: ?

## 2020-08-31 DIAGNOSIS — E1122 Type 2 diabetes mellitus with diabetic chronic kidney disease: Principal | ICD-10-CM

## 2020-08-31 DIAGNOSIS — N183 Type 2 diabetes mellitus with stage 3 chronic kidney disease, with long-term current use of insulin (CMS-HCC): Principal | ICD-10-CM

## 2020-08-31 DIAGNOSIS — Z794 Long term (current) use of insulin: Principal | ICD-10-CM

## 2020-08-31 MED ORDER — ACCU-CHEK GUIDE TEST STRIPS
ORAL_STRIP | 0 refills | 0 days
Start: 2020-08-31 — End: ?

## 2020-08-31 MED ORDER — AMITRIPTYLINE 100 MG TABLET
ORAL_TABLET | 2 refills | 0 days | Status: CP
Start: 2020-08-31 — End: ?

## 2020-09-10 DIAGNOSIS — G629 Polyneuropathy, unspecified: Principal | ICD-10-CM

## 2020-09-10 DIAGNOSIS — G894 Chronic pain syndrome: Principal | ICD-10-CM

## 2020-09-10 MED ORDER — METHADONE 10 MG TABLET
ORAL_TABLET | Freq: Four times a day (QID) | ORAL | 0 refills | 30 days | Status: CP
Start: 2020-09-10 — End: ?

## 2020-10-08 DIAGNOSIS — G894 Chronic pain syndrome: Principal | ICD-10-CM

## 2020-10-08 DIAGNOSIS — G629 Polyneuropathy, unspecified: Principal | ICD-10-CM

## 2020-10-10 MED ORDER — METHADONE 10 MG TABLET
ORAL_TABLET | Freq: Four times a day (QID) | ORAL | 0 refills | 30.00000 days | Status: CP
Start: 2020-10-10 — End: ?

## 2020-11-03 DIAGNOSIS — G629 Polyneuropathy, unspecified: Principal | ICD-10-CM

## 2020-11-03 DIAGNOSIS — G894 Chronic pain syndrome: Principal | ICD-10-CM

## 2020-11-03 MED ORDER — METHADONE 10 MG TABLET
ORAL_TABLET | Freq: Four times a day (QID) | ORAL | 0 refills | 30 days
Start: 2020-11-03 — End: ?

## 2020-11-04 MED ORDER — METHADONE 10 MG TABLET
ORAL_TABLET | Freq: Four times a day (QID) | ORAL | 0 refills | 30 days | Status: CP
Start: 2020-11-04 — End: ?

## 2020-12-02 DIAGNOSIS — G629 Polyneuropathy, unspecified: Principal | ICD-10-CM

## 2020-12-02 DIAGNOSIS — G894 Chronic pain syndrome: Principal | ICD-10-CM

## 2020-12-02 MED ORDER — METHADONE 10 MG TABLET
ORAL_TABLET | Freq: Four times a day (QID) | ORAL | 0 refills | 30 days | Status: CP
Start: 2020-12-02 — End: ?

## 2020-12-30 DIAGNOSIS — G629 Polyneuropathy, unspecified: Principal | ICD-10-CM

## 2020-12-30 DIAGNOSIS — G894 Chronic pain syndrome: Principal | ICD-10-CM

## 2020-12-30 MED ORDER — METHADONE 10 MG TABLET
ORAL_TABLET | Freq: Four times a day (QID) | ORAL | 0 refills | 30.00000 days | Status: CP
Start: 2020-12-30 — End: ?

## 2021-01-12 DIAGNOSIS — E1165 Type 2 diabetes mellitus with hyperglycemia: Principal | ICD-10-CM

## 2021-01-12 MED ORDER — FUROSEMIDE 20 MG TABLET
ORAL_TABLET | Freq: Every day | ORAL | 3 refills | 90 days | Status: CP
Start: 2021-01-12 — End: 2022-01-12

## 2021-01-27 DIAGNOSIS — G629 Polyneuropathy, unspecified: Principal | ICD-10-CM

## 2021-01-27 DIAGNOSIS — G894 Chronic pain syndrome: Principal | ICD-10-CM

## 2021-01-27 MED ORDER — METHADONE 10 MG TABLET
ORAL_TABLET | Freq: Four times a day (QID) | ORAL | 0 refills | 30 days
Start: 2021-01-27 — End: ?

## 2021-01-31 MED ORDER — METHADONE 10 MG TABLET
ORAL_TABLET | Freq: Four times a day (QID) | ORAL | 0 refills | 30 days | Status: CP
Start: 2021-01-31 — End: ?

## 2021-02-03 ENCOUNTER — Ambulatory Visit
Admit: 2021-02-03 | Discharge: 2021-02-04 | Payer: MEDICAID | Attending: Infectious Disease | Primary: Infectious Disease

## 2021-02-03 DIAGNOSIS — G629 Polyneuropathy, unspecified: Principal | ICD-10-CM

## 2021-02-03 DIAGNOSIS — E1165 Type 2 diabetes mellitus with hyperglycemia: Principal | ICD-10-CM

## 2021-02-03 DIAGNOSIS — Z21 Asymptomatic human immunodeficiency virus [HIV] infection status: Principal | ICD-10-CM

## 2021-02-03 DIAGNOSIS — K219 Gastro-esophageal reflux disease without esophagitis: Principal | ICD-10-CM

## 2021-02-03 DIAGNOSIS — N183 Type 2 diabetes mellitus with stage 3 chronic kidney disease, with long-term current use of insulin, unspecified whether stage 3a or 3b CKD (CMS-HCC): Principal | ICD-10-CM

## 2021-02-03 DIAGNOSIS — Z794 Long term (current) use of insulin: Principal | ICD-10-CM

## 2021-02-03 DIAGNOSIS — E782 Mixed hyperlipidemia: Principal | ICD-10-CM

## 2021-02-03 DIAGNOSIS — E119 Type 2 diabetes mellitus without complications: Principal | ICD-10-CM

## 2021-02-03 DIAGNOSIS — J9801 Acute bronchospasm: Principal | ICD-10-CM

## 2021-02-03 DIAGNOSIS — Z23 Encounter for immunization: Principal | ICD-10-CM

## 2021-02-03 DIAGNOSIS — E1122 Type 2 diabetes mellitus with diabetic chronic kidney disease: Principal | ICD-10-CM

## 2021-02-03 MED ORDER — PRAVASTATIN 20 MG TABLET
ORAL_TABLET | Freq: Every day | ORAL | 8 refills | 90.00000 days | Status: CP
Start: 2021-02-03 — End: 2022-02-03

## 2021-02-03 MED ORDER — OMEPRAZOLE 40 MG CAPSULE,DELAYED RELEASE
ORAL_CAPSULE | Freq: Every day | ORAL | 8 refills | 30 days | Status: CP | PRN
Start: 2021-02-03 — End: ?

## 2021-02-03 MED ORDER — DOVATO 50 MG-300 MG TABLET
ORAL_TABLET | Freq: Every evening | ORAL | 8 refills | 0.00000 days | Status: CP
Start: 2021-02-03 — End: ?

## 2021-02-03 MED ORDER — METFORMIN 1,000 MG TABLET
ORAL_TABLET | Freq: Two times a day (BID) | ORAL | 3 refills | 90 days | Status: CP
Start: 2021-02-03 — End: ?

## 2021-02-03 MED ORDER — FUROSEMIDE 20 MG TABLET
ORAL_TABLET | Freq: Every day | ORAL | 3 refills | 90 days | Status: CP
Start: 2021-02-03 — End: 2022-02-03

## 2021-02-03 MED ORDER — NOVOFINE PLUS 32 GAUGE X 1/6" NEEDLE (4 MM)
Freq: Every evening | PRN refills | 0 days | Status: CP
Start: 2021-02-03 — End: 2022-02-03

## 2021-02-03 MED ORDER — ASPIRIN 81 MG TABLET,DELAYED RELEASE
ORAL_TABLET | Freq: Every day | ORAL | 3 refills | 90.00000 days | Status: CP
Start: 2021-02-03 — End: ?

## 2021-02-03 MED ORDER — AMITRIPTYLINE 100 MG TABLET
ORAL_TABLET | Freq: Every evening | ORAL | 2 refills | 90.00000 days | Status: CP | PRN
Start: 2021-02-03 — End: ?

## 2021-02-03 MED ORDER — ACCU-CHEK GUIDE TEST STRIPS
ORAL_STRIP | 0 refills | 0 days | Status: CP
Start: 2021-02-03 — End: ?

## 2021-02-03 MED ORDER — ALBUTEROL SULFATE HFA 90 MCG/ACTUATION AEROSOL INHALER
Freq: Four times a day (QID) | RESPIRATORY_TRACT | PRN refills | 25 days | Status: CP | PRN
Start: 2021-02-03 — End: 2022-02-03

## 2021-02-03 MED ORDER — LANCETS
1 refills | 0 days | Status: CP
Start: 2021-02-03 — End: ?

## 2021-02-03 MED ORDER — ACYCLOVIR 400 MG TABLET
ORAL_TABLET | Freq: Three times a day (TID) | ORAL | 4 refills | 7.00000 days | Status: CP | PRN
Start: 2021-02-03 — End: ?

## 2021-02-03 MED ORDER — PIOGLITAZONE 15 MG TABLET
ORAL_TABLET | Freq: Every day | ORAL | 3 refills | 90 days | Status: CP
Start: 2021-02-03 — End: 2022-02-03

## 2021-02-03 MED ORDER — INSULIN GLARGINE (U-100) 100 UNIT/ML (3 ML) SUBCUTANEOUS PEN
Freq: Every day | SUBCUTANEOUS | 7 refills | 41 days | Status: CP
Start: 2021-02-03 — End: 2022-02-03

## 2021-02-19 NOTE — Unmapped (Signed)
[facility] Specialty Medication Onboarding    Specialty Medication: DOVATO 50-300 mg Tab (dolutegravir-lamivudine)  Prior Authorization: Not Required   Financial Assistance: No - copay  <$25  Final Copay/Day Supply: $0 / 30    Insurance Restrictions: None     Notes to Pharmacist:     The triage team has completed the benefits investigation and has determined that the patient is able to fill this medication at [facility]. Please contact the patient to complete the onboarding or follow up with the prescribing physician as needed.

## 2021-03-02 DIAGNOSIS — J9801 Acute bronchospasm: Principal | ICD-10-CM

## 2021-03-02 DIAGNOSIS — E1165 Type 2 diabetes mellitus with hyperglycemia: Principal | ICD-10-CM

## 2021-03-02 DIAGNOSIS — E1122 Type 2 diabetes mellitus with diabetic chronic kidney disease: Principal | ICD-10-CM

## 2021-03-02 DIAGNOSIS — Z794 Long term (current) use of insulin: Principal | ICD-10-CM

## 2021-03-02 DIAGNOSIS — N183 Type 2 diabetes mellitus with stage 3 chronic kidney disease, with long-term current use of insulin (CMS-HCC): Principal | ICD-10-CM

## 2021-03-02 MED ORDER — ACCU-CHEK GUIDE TEST STRIPS
ORAL_STRIP | 2 refills | 0 days
Start: 2021-03-02 — End: ?

## 2021-03-02 MED ORDER — LANTUS SOLOSTAR U-100 INSULIN 100 UNIT/ML (3 ML) SUBCUTANEOUS PEN
11 refills | 0 days
Start: 2021-03-02 — End: ?

## 2021-03-02 MED ORDER — ALBUTEROL SULFATE HFA 90 MCG/ACTUATION AEROSOL INHALER
PRN refills | 0 days
Start: 2021-03-02 — End: ?

## 2021-03-03 DIAGNOSIS — G629 Polyneuropathy, unspecified: Principal | ICD-10-CM

## 2021-03-03 DIAGNOSIS — G894 Chronic pain syndrome: Principal | ICD-10-CM

## 2021-03-03 MED ORDER — METHADONE 10 MG TABLET
ORAL_TABLET | Freq: Four times a day (QID) | ORAL | 0 refills | 30 days
Start: 2021-03-03 — End: ?

## 2021-03-04 DIAGNOSIS — G894 Chronic pain syndrome: Principal | ICD-10-CM

## 2021-03-04 DIAGNOSIS — Z794 Long term (current) use of insulin: Principal | ICD-10-CM

## 2021-03-04 DIAGNOSIS — E782 Mixed hyperlipidemia: Principal | ICD-10-CM

## 2021-03-04 DIAGNOSIS — G629 Polyneuropathy, unspecified: Principal | ICD-10-CM

## 2021-03-04 DIAGNOSIS — K219 Gastro-esophageal reflux disease without esophagitis: Principal | ICD-10-CM

## 2021-03-04 DIAGNOSIS — J9801 Acute bronchospasm: Principal | ICD-10-CM

## 2021-03-04 DIAGNOSIS — N183 Type 2 diabetes mellitus with stage 3 chronic kidney disease, with long-term current use of insulin, unspecified whether stage 3a or 3b CKD (CMS-HCC): Principal | ICD-10-CM

## 2021-03-04 DIAGNOSIS — Z21 Asymptomatic human immunodeficiency virus [HIV] infection status: Principal | ICD-10-CM

## 2021-03-04 DIAGNOSIS — E1165 Type 2 diabetes mellitus with hyperglycemia: Principal | ICD-10-CM

## 2021-03-04 DIAGNOSIS — E119 Type 2 diabetes mellitus without complications: Principal | ICD-10-CM

## 2021-03-04 DIAGNOSIS — E1122 Type 2 diabetes mellitus with diabetic chronic kidney disease: Principal | ICD-10-CM

## 2021-03-04 MED ORDER — PRAVASTATIN 20 MG TABLET
ORAL_TABLET | Freq: Every day | ORAL | 8 refills | 90 days | Status: CP
Start: 2021-03-04 — End: 2022-03-04

## 2021-03-04 MED ORDER — AMITRIPTYLINE 100 MG TABLET
ORAL_TABLET | Freq: Every evening | ORAL | 2 refills | 90 days | Status: CP | PRN
Start: 2021-03-04 — End: ?

## 2021-03-04 MED ORDER — ASPIRIN 81 MG TABLET,DELAYED RELEASE
ORAL_TABLET | Freq: Every day | ORAL | 3 refills | 90.00000 days | Status: CP
Start: 2021-03-04 — End: ?

## 2021-03-04 MED ORDER — LANTUS SOLOSTAR U-100 INSULIN 100 UNIT/ML (3 ML) SUBCUTANEOUS PEN
11 refills | 0.00000 days
Start: 2021-03-04 — End: ?

## 2021-03-04 MED ORDER — ACCU-CHEK GUIDE TEST STRIPS
ORAL_STRIP | SUBCUTANEOUS | 0 refills | 0.00000 days | Status: CP
Start: 2021-03-04 — End: ?

## 2021-03-04 MED ORDER — DOVATO 50 MG-300 MG TABLET
ORAL_TABLET | Freq: Every evening | ORAL | 8 refills | 0 days | Status: CP
Start: 2021-03-04 — End: ?

## 2021-03-04 MED ORDER — OMEPRAZOLE 40 MG CAPSULE,DELAYED RELEASE
ORAL_CAPSULE | Freq: Every day | ORAL | 8 refills | 30.00000 days | Status: CP | PRN
Start: 2021-03-04 — End: ?

## 2021-03-04 MED ORDER — ALBUTEROL SULFATE HFA 90 MCG/ACTUATION AEROSOL INHALER
Freq: Four times a day (QID) | RESPIRATORY_TRACT | PRN refills | 0.00000 days | Status: CP | PRN
Start: 2021-03-04 — End: 2022-03-04

## 2021-03-04 MED ORDER — METHADONE 10 MG TABLET
ORAL_TABLET | Freq: Four times a day (QID) | ORAL | 0 refills | 30.00000 days | Status: CP
Start: 2021-03-04 — End: 2021-03-04

## 2021-03-04 MED ORDER — LANCETS
1 refills | 0 days | Status: CP
Start: 2021-03-04 — End: ?

## 2021-03-04 MED ORDER — ACYCLOVIR 400 MG TABLET
ORAL_TABLET | Freq: Three times a day (TID) | ORAL | 4 refills | 7 days | Status: CP | PRN
Start: 2021-03-04 — End: ?

## 2021-03-04 MED ORDER — PIOGLITAZONE 15 MG TABLET
ORAL_TABLET | Freq: Every day | ORAL | 3 refills | 90 days | Status: CP
Start: 2021-03-04 — End: 2022-03-04

## 2021-03-04 MED ORDER — NOVOFINE PLUS 32 GAUGE X 1/6" NEEDLE (4 MM)
Freq: Every evening | PRN refills | 0.00000 days | Status: CP
Start: 2021-03-04 — End: 2022-03-04

## 2021-03-04 MED ORDER — INSULIN GLARGINE (U-100) 100 UNIT/ML (3 ML) SUBCUTANEOUS PEN
Freq: Every day | SUBCUTANEOUS | 7 refills | 41 days | Status: CP
Start: 2021-03-04 — End: 2022-03-04

## 2021-03-04 MED ORDER — FUROSEMIDE 20 MG TABLET
ORAL_TABLET | Freq: Every day | ORAL | 3 refills | 90 days | Status: CP
Start: 2021-03-04 — End: 2022-03-04

## 2021-03-04 MED ORDER — METFORMIN 1,000 MG TABLET
ORAL_TABLET | Freq: Two times a day (BID) | ORAL | 3 refills | 90.00000 days | Status: CP
Start: 2021-03-04 — End: ?

## 2021-03-04 NOTE — Unmapped (Signed)
refiethadone q month

## 2021-03-04 NOTE — Unmapped (Signed)
Dr.Margolis had sent all patient Rx's to [facility]. Called patient to confirm which pharmacy she used and she asked they all be sent to [facility] on [address]. All Rx's except for Methadone were resent as written by Dr.Margolis to [facility].

## 2021-03-05 NOTE — Unmapped (Signed)
I spoke with patient - she has requested all of Dr. Herbert Spires prescriptions be send to [facility]. I provided pharmacy's contact information and offered she could call us back if she had future trouble.    Anna Cantrell A. Katrinka Blazing, PharmD, BCPS - Pharmacist   [facility]       Specialty Medication(s): Dovato    Anna Cantrell has been dis-enrolled from the [facility] specialty pharmacy services due to a pharmacy change. The patient is now filling at [facility].    Additional information provided to the patient: patient aware.    Lanney Gins  [facility] Specialty Pharmacist

## 2021-03-05 NOTE — Unmapped (Signed)
Medication refill

## 2021-04-02 DIAGNOSIS — G894 Chronic pain syndrome: Principal | ICD-10-CM

## 2021-04-02 DIAGNOSIS — G629 Polyneuropathy, unspecified: Principal | ICD-10-CM

## 2021-04-02 MED ORDER — METHADONE 10 MG TABLET
ORAL_TABLET | Freq: Four times a day (QID) | ORAL | 0 refills | 30 days | Status: CP
Start: 2021-04-02 — End: ?

## 2021-04-08 DIAGNOSIS — E1165 Type 2 diabetes mellitus with hyperglycemia: Principal | ICD-10-CM

## 2021-04-08 MED ORDER — METFORMIN 1,000 MG TABLET
ORAL_TABLET | Freq: Two times a day (BID) | ORAL | 0 refills | 90 days | Status: CP
Start: 2021-04-08 — End: ?

## 2021-04-28 DIAGNOSIS — G629 Polyneuropathy, unspecified: Principal | ICD-10-CM

## 2021-04-28 DIAGNOSIS — G894 Chronic pain syndrome: Principal | ICD-10-CM

## 2021-04-28 MED ORDER — METHADONE 10 MG TABLET
ORAL_TABLET | Freq: Four times a day (QID) | ORAL | 0 refills | 30 days
Start: 2021-04-28 — End: ?

## 2021-04-30 MED ORDER — METHADONE 10 MG TABLET
ORAL_TABLET | Freq: Four times a day (QID) | ORAL | 0 refills | 30.00000 days | Status: CP
Start: 2021-04-30 — End: ?

## 2021-05-28 DIAGNOSIS — G629 Polyneuropathy, unspecified: Principal | ICD-10-CM

## 2021-05-28 DIAGNOSIS — G894 Chronic pain syndrome: Principal | ICD-10-CM

## 2021-05-28 MED ORDER — METHADONE 10 MG TABLET
ORAL_TABLET | Freq: Four times a day (QID) | ORAL | 0 refills | 30.00000 days | Status: CP
Start: 2021-05-28 — End: ?

## 2021-05-31 MED ORDER — METHADONE 10 MG TABLET
ORAL_TABLET | Freq: Four times a day (QID) | ORAL | 0 refills | 30 days | Status: CP
Start: 2021-05-31 — End: ?

## 2021-06-02 IMAGING — CR DG RIBS W/ CHEST 3+V*L*
5 series · 5 of 5 positions shown · non-contrast
Comparison: 03/28/2018

CLINICAL DATA: Left-sided flank and rib pain for 3 days

EXAM:
LEFT RIBS AND CHEST - 3+ VIEW

[chest pa]
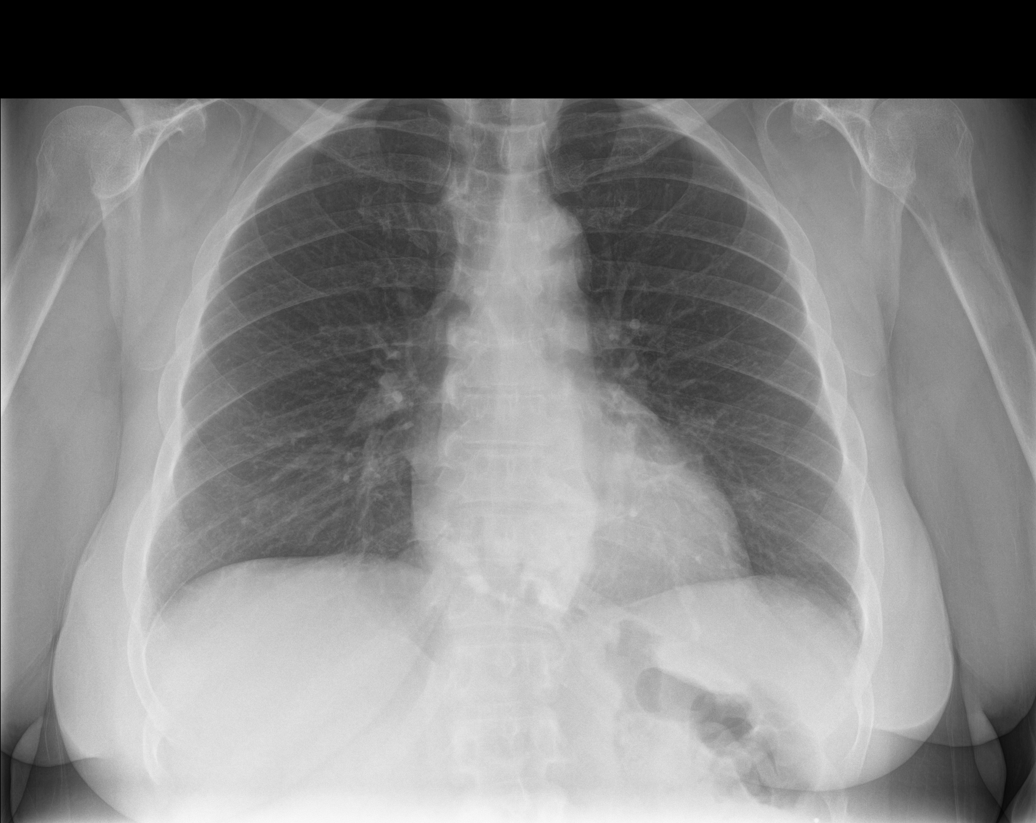

[rib pa (1 of 2)]
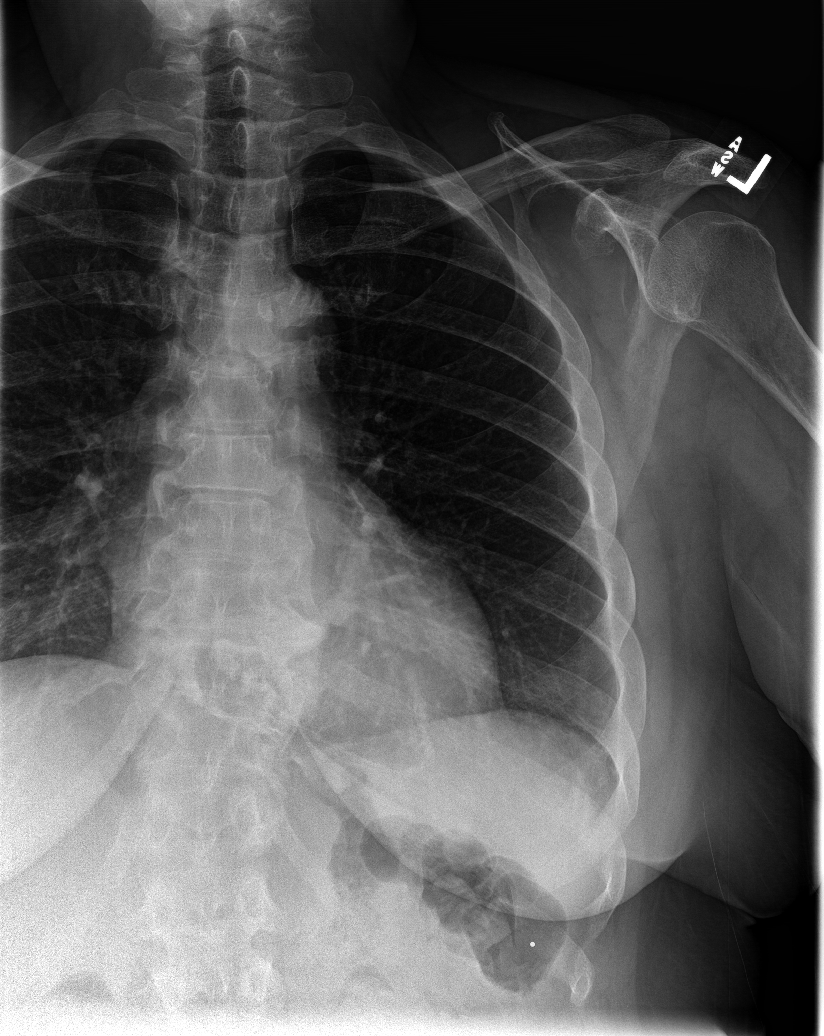

[rib pa (2 of 2)]
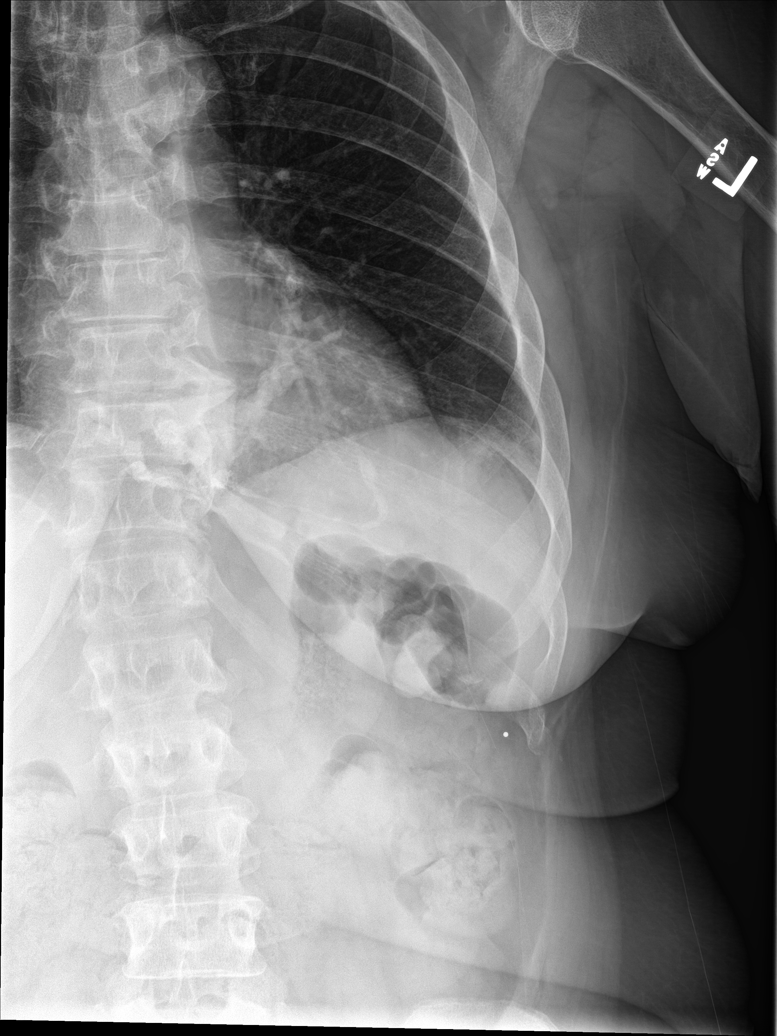

[rib obl (1 of 2)]
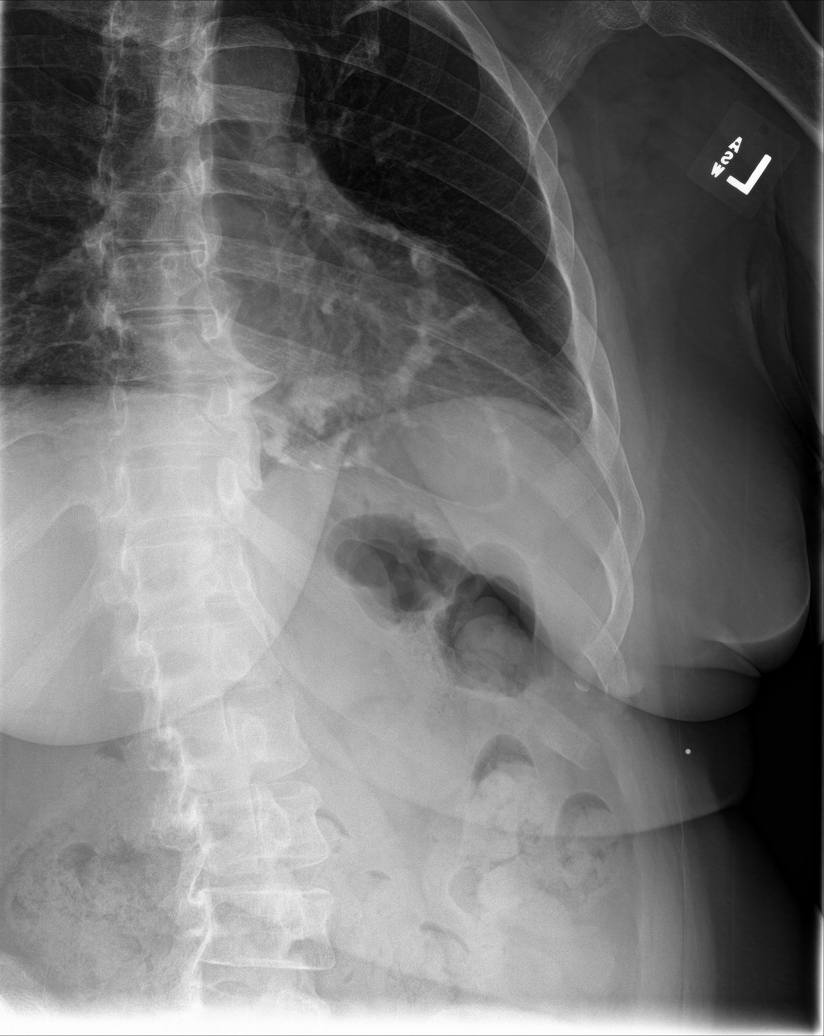

[rib obl (2 of 2)]
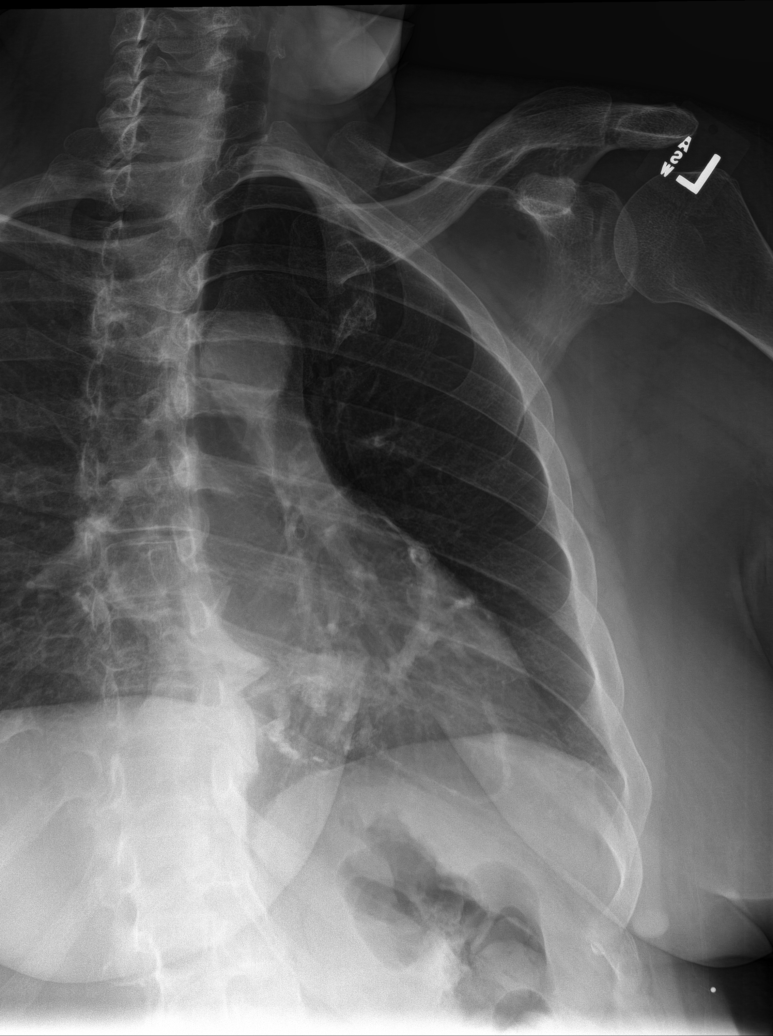

[5 of 5 positions shown; findings below may reference images not displayed]

FINDINGS: Frontal view of the chest as well as frontal and oblique views of
the left thoracic cage are obtained. There are no acute displaced
fractures. Cardiac silhouette is stable. Pericardial calcifications
are again noted. No airspace disease, effusion, or pneumothorax.
IMPRESSION: 1. No acute intrathoracic process.
2. No acute displaced rib fractures.
3. Stable pericardial calcifications consistent with previous
pericarditis.

## 2021-06-23 DIAGNOSIS — G629 Polyneuropathy, unspecified: Principal | ICD-10-CM

## 2021-06-23 DIAGNOSIS — G894 Chronic pain syndrome: Principal | ICD-10-CM

## 2021-06-23 MED ORDER — METHADONE 10 MG TABLET
ORAL_TABLET | Freq: Four times a day (QID) | ORAL | 0 refills | 30 days
Start: 2021-06-23 — End: ?

## 2021-06-24 MED ORDER — METHADONE 10 MG TABLET
ORAL_TABLET | Freq: Four times a day (QID) | ORAL | 0 refills | 30 days | Status: CP
Start: 2021-06-24 — End: ?

## 2021-07-28 DIAGNOSIS — G629 Polyneuropathy, unspecified: Principal | ICD-10-CM

## 2021-07-28 DIAGNOSIS — G894 Chronic pain syndrome: Principal | ICD-10-CM

## 2021-07-28 MED ORDER — METHADONE 10 MG TABLET
ORAL_TABLET | Freq: Four times a day (QID) | ORAL | 0 refills | 30 days
Start: 2021-07-28 — End: ?

## 2021-07-29 MED ORDER — METHADONE 10 MG TABLET
ORAL_TABLET | Freq: Four times a day (QID) | ORAL | 0 refills | 30 days | Status: CP
Start: 2021-07-29 — End: ?

## 2021-08-04 ENCOUNTER — Ambulatory Visit
Admit: 2021-08-04 | Discharge: 2021-08-05 | Payer: MEDICAID | Attending: Infectious Disease | Primary: Infectious Disease

## 2021-08-04 DIAGNOSIS — Z21 Asymptomatic human immunodeficiency virus [HIV] infection status: Principal | ICD-10-CM

## 2021-08-12 DIAGNOSIS — K219 Gastro-esophageal reflux disease without esophagitis: Principal | ICD-10-CM

## 2021-08-12 MED ORDER — OMEPRAZOLE 40 MG CAPSULE,DELAYED RELEASE
ORAL_CAPSULE | Freq: Every day | ORAL | 8 refills | 30 days | Status: CP | PRN
Start: 2021-08-12 — End: ?

## 2021-08-26 DIAGNOSIS — N183 Type 2 diabetes mellitus with stage 3 chronic kidney disease, with long-term current use of insulin, unspecified whether stage 3a or 3b CKD (CMS-HCC): Principal | ICD-10-CM

## 2021-08-26 DIAGNOSIS — E1122 Type 2 diabetes mellitus with diabetic chronic kidney disease: Principal | ICD-10-CM

## 2021-08-26 DIAGNOSIS — Z794 Long term (current) use of insulin: Principal | ICD-10-CM

## 2021-08-26 MED ORDER — PIOGLITAZONE 15 MG TABLET
ORAL_TABLET | Freq: Every day | ORAL | 3 refills | 90 days
Start: 2021-08-26 — End: 2022-08-26

## 2021-08-30 DIAGNOSIS — G894 Chronic pain syndrome: Principal | ICD-10-CM

## 2021-08-30 DIAGNOSIS — G629 Polyneuropathy, unspecified: Principal | ICD-10-CM

## 2021-08-30 MED ORDER — PIOGLITAZONE 15 MG TABLET
ORAL_TABLET | Freq: Every day | ORAL | 3 refills | 90 days | Status: CP
Start: 2021-08-30 — End: 2022-08-30

## 2021-08-30 MED ORDER — METHADONE 10 MG TABLET
ORAL_TABLET | Freq: Four times a day (QID) | ORAL | 0 refills | 30 days | Status: CP
Start: 2021-08-30 — End: ?

## 2021-09-13 DIAGNOSIS — Z794 Long term (current) use of insulin: Principal | ICD-10-CM

## 2021-09-13 DIAGNOSIS — N183 Type 2 diabetes mellitus with stage 3 chronic kidney disease, with long-term current use of insulin (CMS-HCC): Principal | ICD-10-CM

## 2021-09-13 DIAGNOSIS — E1165 Type 2 diabetes mellitus with hyperglycemia: Principal | ICD-10-CM

## 2021-09-13 DIAGNOSIS — E1122 Type 2 diabetes mellitus with diabetic chronic kidney disease: Principal | ICD-10-CM

## 2021-09-13 MED ORDER — ACCU-CHEK GUIDE TEST STRIPS
ORAL_STRIP | 0 refills | 0 days | Status: CP
Start: 2021-09-13 — End: ?

## 2021-09-15 DIAGNOSIS — Z794 Long term (current) use of insulin: Principal | ICD-10-CM

## 2021-09-15 DIAGNOSIS — E119 Type 2 diabetes mellitus without complications: Principal | ICD-10-CM

## 2021-09-15 MED ORDER — LANCETS
4 refills | 0 days | Status: CP
Start: 2021-09-15 — End: ?

## 2021-09-27 DIAGNOSIS — G629 Polyneuropathy, unspecified: Principal | ICD-10-CM

## 2021-09-27 DIAGNOSIS — G894 Chronic pain syndrome: Principal | ICD-10-CM

## 2021-09-27 MED ORDER — METHADONE 10 MG TABLET
ORAL_TABLET | Freq: Four times a day (QID) | ORAL | 0 refills | 30 days
Start: 2021-09-27 — End: ?

## 2021-09-29 MED ORDER — METHADONE 10 MG TABLET
ORAL_TABLET | Freq: Four times a day (QID) | ORAL | 0 refills | 30 days | Status: CP
Start: 2021-09-29 — End: ?

## 2021-09-30 DIAGNOSIS — G629 Polyneuropathy, unspecified: Principal | ICD-10-CM

## 2021-09-30 DIAGNOSIS — G894 Chronic pain syndrome: Principal | ICD-10-CM

## 2021-09-30 MED ORDER — METHADONE 10 MG TABLET
ORAL_TABLET | Freq: Four times a day (QID) | ORAL | 0 refills | 30 days | Status: CP
Start: 2021-09-30 — End: ?

## 2021-10-25 DIAGNOSIS — G894 Chronic pain syndrome: Principal | ICD-10-CM

## 2021-10-25 DIAGNOSIS — G629 Polyneuropathy, unspecified: Principal | ICD-10-CM

## 2021-10-25 MED ORDER — METHADONE 10 MG TABLET
ORAL_TABLET | Freq: Four times a day (QID) | ORAL | 0 refills | 30 days
Start: 2021-10-25 — End: ?

## 2021-10-26 MED ORDER — METHADONE 10 MG TABLET
ORAL_TABLET | Freq: Four times a day (QID) | ORAL | 0 refills | 30 days
Start: 2021-10-26 — End: ?

## 2021-10-29 MED ORDER — METHADONE 10 MG TABLET
ORAL_TABLET | Freq: Four times a day (QID) | ORAL | 0 refills | 30 days | Status: CP
Start: 2021-10-29 — End: 2021-11-28

## 2021-11-18 DIAGNOSIS — G629 Polyneuropathy, unspecified: Principal | ICD-10-CM

## 2021-11-18 DIAGNOSIS — G894 Chronic pain syndrome: Principal | ICD-10-CM

## 2021-11-18 MED ORDER — METHADONE 10 MG TABLET
ORAL_TABLET | Freq: Four times a day (QID) | ORAL | 0 refills | 30 days | Status: CP
Start: 2021-11-18 — End: 2021-12-18

## 2021-12-11 DIAGNOSIS — E1165 Type 2 diabetes mellitus with hyperglycemia: Principal | ICD-10-CM

## 2021-12-22 ENCOUNTER — Ambulatory Visit
Admit: 2021-12-22 | Discharge: 2021-12-23 | Payer: MEDICAID | Attending: Infectious Disease | Primary: Infectious Disease

## 2021-12-22 DIAGNOSIS — G629 Polyneuropathy, unspecified: Principal | ICD-10-CM

## 2021-12-22 DIAGNOSIS — Z21 Asymptomatic human immunodeficiency virus [HIV] infection status: Principal | ICD-10-CM

## 2021-12-22 DIAGNOSIS — Z23 Encounter for immunization: Principal | ICD-10-CM

## 2021-12-22 DIAGNOSIS — G894 Chronic pain syndrome: Principal | ICD-10-CM

## 2021-12-22 DIAGNOSIS — E1165 Type 2 diabetes mellitus with hyperglycemia: Principal | ICD-10-CM

## 2021-12-22 MED ORDER — METHADONE 10 MG TABLET
ORAL_TABLET | Freq: Four times a day (QID) | ORAL | 0 refills | 30 days | Status: CP
Start: 2021-12-22 — End: 2022-01-21

## 2022-01-17 DIAGNOSIS — Z794 Long term (current) use of insulin: Principal | ICD-10-CM

## 2022-01-17 DIAGNOSIS — E119 Type 2 diabetes mellitus without complications: Principal | ICD-10-CM

## 2022-01-17 MED ORDER — LANCETS
4 refills | 0 days | Status: CP
Start: 2022-01-17 — End: ?

## 2022-01-19 DIAGNOSIS — G894 Chronic pain syndrome: Principal | ICD-10-CM

## 2022-01-19 DIAGNOSIS — Z006 Encounter for examination for normal comparison and control in clinical research program: Principal | ICD-10-CM

## 2022-01-19 DIAGNOSIS — G629 Polyneuropathy, unspecified: Principal | ICD-10-CM

## 2022-01-19 MED ORDER — METHADONE 10 MG TABLET
ORAL_TABLET | Freq: Four times a day (QID) | ORAL | 0 refills | 30 days | Status: CP
Start: 2022-01-19 — End: 2022-02-18

## 2022-01-31 ENCOUNTER — Institutional Professional Consult (permissible substitution): Admit: 2022-01-31 | Discharge: 2022-02-01

## 2022-02-01 ENCOUNTER — Institutional Professional Consult (permissible substitution): Admit: 2022-02-01 | Discharge: 2022-02-02 | Payer: MEDICARE

## 2022-02-02 ENCOUNTER — Ambulatory Visit: Admit: 2022-02-02 | Discharge: 2022-02-03 | Attending: Infectious Disease | Primary: Infectious Disease

## 2022-02-02 ENCOUNTER — Institutional Professional Consult (permissible substitution): Admit: 2022-02-02 | Discharge: 2022-02-03

## 2022-02-02 DIAGNOSIS — Z006 Encounter for examination for normal comparison and control in clinical research program: Principal | ICD-10-CM

## 2022-02-08 DIAGNOSIS — Z21 Asymptomatic human immunodeficiency virus [HIV] infection status: Principal | ICD-10-CM

## 2022-02-08 MED ORDER — DOVATO 50 MG-300 MG TABLET
ORAL_TABLET | Freq: Every evening | ORAL | 1 refills | 0 days | Status: CP
Start: 2022-02-08 — End: ?

## 2022-02-16 DIAGNOSIS — G894 Chronic pain syndrome: Principal | ICD-10-CM

## 2022-02-16 DIAGNOSIS — G629 Polyneuropathy, unspecified: Principal | ICD-10-CM

## 2022-02-16 MED ORDER — METHADONE 10 MG TABLET
ORAL_TABLET | Freq: Four times a day (QID) | ORAL | 0 refills | 30 days | Status: CP
Start: 2022-02-16 — End: 2022-03-18

## 2022-03-09 ENCOUNTER — Institutional Professional Consult (permissible substitution): Admit: 2022-03-09 | Discharge: 2022-03-10

## 2022-03-09 DIAGNOSIS — Z006 Encounter for examination for normal comparison and control in clinical research program: Principal | ICD-10-CM

## 2022-03-15 ENCOUNTER — Ambulatory Visit: Admit: 2022-03-15 | Discharge: 2022-03-16

## 2022-03-16 DIAGNOSIS — G894 Chronic pain syndrome: Principal | ICD-10-CM

## 2022-03-16 DIAGNOSIS — G629 Polyneuropathy, unspecified: Principal | ICD-10-CM

## 2022-03-16 MED ORDER — METHADONE 10 MG TABLET
ORAL_TABLET | Freq: Four times a day (QID) | ORAL | 0 refills | 30 days
Start: 2022-03-16 — End: 2022-04-15

## 2022-03-19 MED ORDER — METHADONE 10 MG TABLET
ORAL_TABLET | Freq: Four times a day (QID) | ORAL | 0 refills | 30 days | Status: CP
Start: 2022-03-19 — End: 2022-04-18

## 2022-03-22 ENCOUNTER — Institutional Professional Consult (permissible substitution): Admit: 2022-03-22 | Discharge: 2022-03-23

## 2022-03-22 DIAGNOSIS — Z006 Encounter for examination for normal comparison and control in clinical research program: Principal | ICD-10-CM

## 2022-03-22 DIAGNOSIS — E1165 Type 2 diabetes mellitus with hyperglycemia: Principal | ICD-10-CM

## 2022-03-22 MED ORDER — METFORMIN 1,000 MG TABLET
ORAL_TABLET | Freq: Two times a day (BID) | ORAL | 3 refills | 90 days | Status: CP
Start: 2022-03-22 — End: ?

## 2022-03-22 MED ORDER — FUROSEMIDE 20 MG TABLET
ORAL_TABLET | Freq: Every day | ORAL | 3 refills | 90 days | Status: CP
Start: 2022-03-22 — End: 2023-03-22

## 2022-03-23 ENCOUNTER — Ambulatory Visit
Admit: 2022-03-23 | Discharge: 2022-03-24 | Payer: MEDICARE | Attending: Infectious Disease | Primary: Infectious Disease

## 2022-03-23 DIAGNOSIS — E1169 Type 2 diabetes mellitus with other specified complication: Principal | ICD-10-CM

## 2022-03-23 DIAGNOSIS — E782 Mixed hyperlipidemia: Principal | ICD-10-CM

## 2022-03-23 DIAGNOSIS — G629 Polyneuropathy, unspecified: Principal | ICD-10-CM

## 2022-03-23 DIAGNOSIS — Z21 Asymptomatic human immunodeficiency virus [HIV] infection status: Principal | ICD-10-CM

## 2022-03-23 DIAGNOSIS — E1165 Type 2 diabetes mellitus with hyperglycemia: Principal | ICD-10-CM

## 2022-03-23 DIAGNOSIS — Z794 Long term (current) use of insulin: Principal | ICD-10-CM

## 2022-03-23 DIAGNOSIS — J9801 Acute bronchospasm: Principal | ICD-10-CM

## 2022-03-23 DIAGNOSIS — Z5181 Encounter for therapeutic drug level monitoring: Principal | ICD-10-CM

## 2022-03-23 DIAGNOSIS — N183 Type 2 diabetes mellitus with stage 3 chronic kidney disease, with long-term current use of insulin, unspecified whether stage 3a or 3b CKD (CMS-HCC): Principal | ICD-10-CM

## 2022-03-23 DIAGNOSIS — E1122 Type 2 diabetes mellitus with diabetic chronic kidney disease: Principal | ICD-10-CM

## 2022-03-23 DIAGNOSIS — Z9189 Other specified personal risk factors, not elsewhere classified: Principal | ICD-10-CM

## 2022-03-23 DIAGNOSIS — B20 Human immunodeficiency virus [HIV] disease: Principal | ICD-10-CM

## 2022-03-23 DIAGNOSIS — Z79899 Other long term (current) drug therapy: Principal | ICD-10-CM

## 2022-03-23 MED ORDER — ACCU-CHEK GUIDE TEST STRIPS
ORAL_STRIP | 0 refills | 0 days | Status: CP
Start: 2022-03-23 — End: ?

## 2022-03-23 MED ORDER — PRAVASTATIN 20 MG TABLET
ORAL_TABLET | Freq: Every day | ORAL | 11 refills | 30 days | Status: CP
Start: 2022-03-23 — End: 2023-03-23

## 2022-03-23 MED ORDER — AMITRIPTYLINE 100 MG TABLET
ORAL_TABLET | Freq: Every evening | ORAL | 2 refills | 90 days | Status: CP | PRN
Start: 2022-03-23 — End: ?

## 2022-03-23 MED ORDER — PIOGLITAZONE 15 MG TABLET
ORAL_TABLET | Freq: Every day | ORAL | 3 refills | 90 days | Status: CP
Start: 2022-03-23 — End: 2023-03-23

## 2022-03-23 MED ORDER — LANCETS
4 refills | 0 days | Status: CP
Start: 2022-03-23 — End: ?

## 2022-03-23 MED ORDER — ALBUTEROL SULFATE HFA 90 MCG/ACTUATION AEROSOL INHALER
Freq: Four times a day (QID) | RESPIRATORY_TRACT | PRN refills | 25 days | Status: CP | PRN
Start: 2022-03-23 — End: 2023-03-23

## 2022-03-23 MED ORDER — FUROSEMIDE 20 MG TABLET
ORAL_TABLET | Freq: Every day | ORAL | 3 refills | 90 days | Status: CP
Start: 2022-03-23 — End: 2023-03-23

## 2022-03-23 MED ORDER — ACYCLOVIR 400 MG TABLET
ORAL_TABLET | Freq: Three times a day (TID) | ORAL | 4 refills | 7 days | Status: CP | PRN
Start: 2022-03-23 — End: ?

## 2022-03-23 MED ORDER — METFORMIN 1,000 MG TABLET
ORAL_TABLET | Freq: Two times a day (BID) | ORAL | 3 refills | 90 days | Status: CP
Start: 2022-03-23 — End: ?

## 2022-03-23 MED ORDER — LANTUS SOLOSTAR U-100 INSULIN 100 UNIT/ML (3 ML) SUBCUTANEOUS PEN
Freq: Every day | SUBCUTANEOUS | 7 refills | 39 days | Status: CP
Start: 2022-03-23 — End: 2023-03-23

## 2022-03-23 MED ORDER — DOVATO 50 MG-300 MG TABLET
ORAL_TABLET | Freq: Every evening | ORAL | 1 refills | 0 days | Status: CP
Start: 2022-03-23 — End: ?

## 2022-04-12 DIAGNOSIS — Z1231 Encounter for screening mammogram for malignant neoplasm of breast: Principal | ICD-10-CM

## 2022-04-18 DIAGNOSIS — G894 Chronic pain syndrome: Principal | ICD-10-CM

## 2022-04-18 DIAGNOSIS — G629 Polyneuropathy, unspecified: Principal | ICD-10-CM

## 2022-04-18 MED ORDER — METHADONE 10 MG TABLET
ORAL_TABLET | Freq: Four times a day (QID) | ORAL | 0 refills | 30 days | Status: CP
Start: 2022-04-18 — End: 2022-05-18

## 2022-05-07 DIAGNOSIS — G894 Chronic pain syndrome: Principal | ICD-10-CM

## 2022-05-07 DIAGNOSIS — G629 Polyneuropathy, unspecified: Principal | ICD-10-CM

## 2022-05-13 DIAGNOSIS — Z21 Asymptomatic human immunodeficiency virus [HIV] infection status: Principal | ICD-10-CM

## 2022-05-13 MED ORDER — DOVATO 50 MG-300 MG TABLET
ORAL_TABLET | Freq: Every evening | ORAL | 1 refills | 0 days | Status: CP
Start: 2022-05-13 — End: ?

## 2022-05-18 ENCOUNTER — Ambulatory Visit: Admit: 2022-05-18 | Payer: MEDICARE

## 2022-05-18 MED ORDER — METHADONE 10 MG TABLET
ORAL_TABLET | Freq: Four times a day (QID) | ORAL | 0 refills | 30 days | Status: CP
Start: 2022-05-18 — End: 2022-06-17

## 2022-05-19 DIAGNOSIS — G894 Chronic pain syndrome: Principal | ICD-10-CM

## 2022-05-19 DIAGNOSIS — G629 Polyneuropathy, unspecified: Principal | ICD-10-CM

## 2022-05-19 MED ORDER — METHADONE 10 MG TABLET
ORAL_TABLET | Freq: Four times a day (QID) | ORAL | 0 refills | 30 days | Status: CP
Start: 2022-05-19 — End: 2022-06-18

## 2022-06-15 DIAGNOSIS — G894 Chronic pain syndrome: Principal | ICD-10-CM

## 2022-06-15 DIAGNOSIS — G629 Polyneuropathy, unspecified: Principal | ICD-10-CM

## 2022-06-15 MED ORDER — METHADONE 10 MG TABLET
ORAL_TABLET | Freq: Four times a day (QID) | ORAL | 0 refills | 30 days
Start: 2022-06-15 — End: 2022-07-15

## 2022-06-16 MED ORDER — METHADONE 10 MG TABLET
ORAL_TABLET | Freq: Four times a day (QID) | ORAL | 0 refills | 30 days | Status: CP
Start: 2022-06-16 — End: 2022-07-16

## 2022-06-18 DIAGNOSIS — G629 Polyneuropathy, unspecified: Principal | ICD-10-CM

## 2022-06-18 DIAGNOSIS — G894 Chronic pain syndrome: Principal | ICD-10-CM

## 2022-06-18 MED ORDER — METHADONE 10 MG TABLET
ORAL_TABLET | Freq: Four times a day (QID) | ORAL | 0 refills | 30 days | Status: CP
Start: 2022-06-18 — End: 2022-07-18

## 2022-06-28 ENCOUNTER — Institutional Professional Consult (permissible substitution): Admit: 2022-06-28 | Discharge: 2022-06-29

## 2022-06-28 DIAGNOSIS — Z006 Encounter for examination for normal comparison and control in clinical research program: Principal | ICD-10-CM

## 2022-07-14 DIAGNOSIS — G894 Chronic pain syndrome: Principal | ICD-10-CM

## 2022-07-14 DIAGNOSIS — G629 Polyneuropathy, unspecified: Principal | ICD-10-CM

## 2022-07-14 MED ORDER — METHADONE 10 MG TABLET
ORAL_TABLET | Freq: Four times a day (QID) | ORAL | 0 refills | 30 days
Start: 2022-07-14 — End: 2022-08-13

## 2022-07-15 MED ORDER — METHADONE 10 MG TABLET
ORAL_TABLET | Freq: Four times a day (QID) | ORAL | 0 refills | 30 days | Status: CP
Start: 2022-07-15 — End: 2022-08-14

## 2022-07-21 DIAGNOSIS — Z21 Asymptomatic human immunodeficiency virus [HIV] infection status: Principal | ICD-10-CM

## 2022-07-21 MED ORDER — DOVATO 50 MG-300 MG TABLET
ORAL_TABLET | Freq: Every evening | ORAL | 0 refills | 0 days | Status: CP
Start: 2022-07-21 — End: ?

## 2022-08-10 DIAGNOSIS — G894 Chronic pain syndrome: Principal | ICD-10-CM

## 2022-08-10 DIAGNOSIS — G629 Polyneuropathy, unspecified: Principal | ICD-10-CM

## 2022-08-10 MED ORDER — METHADONE 10 MG TABLET
ORAL_TABLET | Freq: Four times a day (QID) | ORAL | 0 refills | 30 days
Start: 2022-08-10 — End: 2022-09-09

## 2022-08-13 MED ORDER — METHADONE 10 MG TABLET
ORAL_TABLET | Freq: Four times a day (QID) | ORAL | 0 refills | 30 days
Start: 2022-08-13 — End: 2022-09-12

## 2022-08-14 DIAGNOSIS — G894 Chronic pain syndrome: Principal | ICD-10-CM

## 2022-08-14 DIAGNOSIS — G629 Polyneuropathy, unspecified: Principal | ICD-10-CM

## 2022-08-14 MED ORDER — METHADONE 10 MG TABLET
ORAL_TABLET | Freq: Four times a day (QID) | ORAL | 0 refills | 30 days | Status: CP
Start: 2022-08-14 — End: 2022-09-13

## 2022-08-22 DIAGNOSIS — Z21 Asymptomatic human immunodeficiency virus [HIV] infection status: Principal | ICD-10-CM

## 2022-08-22 MED ORDER — DOVATO 50 MG-300 MG TABLET
ORAL_TABLET | Freq: Every evening | ORAL | 0 refills | 0 days
Start: 2022-08-22 — End: ?

## 2022-08-23 DIAGNOSIS — Z21 Asymptomatic human immunodeficiency virus [HIV] infection status: Principal | ICD-10-CM

## 2022-08-23 MED ORDER — DOVATO 50 MG-300 MG TABLET
ORAL_TABLET | Freq: Every evening | ORAL | 0 refills | 0 days
Start: 2022-08-23 — End: ?

## 2022-08-24 ENCOUNTER — Ambulatory Visit
Admit: 2022-08-24 | Discharge: 2022-08-25 | Payer: MEDICAID | Attending: Infectious Disease | Primary: Infectious Disease

## 2022-08-24 DIAGNOSIS — G894 Chronic pain syndrome: Principal | ICD-10-CM

## 2022-08-24 DIAGNOSIS — Z794 Long term (current) use of insulin: Principal | ICD-10-CM

## 2022-08-24 DIAGNOSIS — G629 Polyneuropathy, unspecified: Principal | ICD-10-CM

## 2022-08-24 DIAGNOSIS — E782 Mixed hyperlipidemia: Principal | ICD-10-CM

## 2022-08-24 DIAGNOSIS — Z21 Asymptomatic human immunodeficiency virus [HIV] infection status: Principal | ICD-10-CM

## 2022-08-24 DIAGNOSIS — E1122 Type 2 diabetes mellitus with diabetic chronic kidney disease: Principal | ICD-10-CM

## 2022-08-24 DIAGNOSIS — E1165 Type 2 diabetes mellitus with hyperglycemia: Principal | ICD-10-CM

## 2022-08-24 DIAGNOSIS — Z5181 Encounter for therapeutic drug level monitoring: Principal | ICD-10-CM

## 2022-08-24 DIAGNOSIS — N183 Type 2 diabetes mellitus with stage 3 chronic kidney disease, with long-term current use of insulin, unspecified whether stage 3a or 3b CKD (CMS-HCC): Principal | ICD-10-CM

## 2022-08-24 DIAGNOSIS — Z9189 Other specified personal risk factors, not elsewhere classified: Principal | ICD-10-CM

## 2022-08-24 DIAGNOSIS — B20 Human immunodeficiency virus [HIV] disease: Principal | ICD-10-CM

## 2022-08-24 DIAGNOSIS — J449 Chronic obstructive pulmonary disease, unspecified: Principal | ICD-10-CM

## 2022-08-24 DIAGNOSIS — Z79899 Other long term (current) drug therapy: Principal | ICD-10-CM

## 2022-08-24 MED ORDER — DOVATO 50 MG-300 MG TABLET
ORAL_TABLET | Freq: Every evening | ORAL | 6 refills | 0 days | Status: CP
Start: 2022-08-24 — End: ?

## 2022-08-24 MED ORDER — PIOGLITAZONE 15 MG TABLET
ORAL_TABLET | Freq: Every day | ORAL | 3 refills | 90 days | Status: CP
Start: 2022-08-24 — End: 2023-08-24

## 2022-08-24 MED ORDER — METFORMIN 1,000 MG TABLET
ORAL_TABLET | Freq: Two times a day (BID) | ORAL | 3 refills | 90 days | Status: CP
Start: 2022-08-24 — End: ?

## 2022-08-24 MED ORDER — MOMETASONE-FORMOTEROL HFA 100 MCG-5 MCG/ACTUATION AEROSOL INHALER
Freq: Two times a day (BID) | RESPIRATORY_TRACT | 6 refills | 33.00000 days | Status: CP
Start: 2022-08-24 — End: 2022-08-24

## 2022-08-24 MED ORDER — LANTUS SOLOSTAR U-100 INSULIN 100 UNIT/ML (3 ML) SUBCUTANEOUS PEN
Freq: Every day | SUBCUTANEOUS | 7 refills | 39 days | Status: CP
Start: 2022-08-24 — End: 2023-08-24

## 2022-08-24 MED ORDER — FUROSEMIDE 20 MG TABLET
ORAL_TABLET | Freq: Every day | ORAL | 3 refills | 90 days | Status: CP
Start: 2022-08-24 — End: 2023-08-24

## 2022-08-24 MED ORDER — AMITRIPTYLINE 100 MG TABLET
ORAL_TABLET | Freq: Every evening | ORAL | 2 refills | 90 days | Status: CP | PRN
Start: 2022-08-24 — End: ?

## 2022-08-24 MED ORDER — PRAVASTATIN 20 MG TABLET
ORAL_TABLET | Freq: Every day | ORAL | 11 refills | 30 days | Status: CP
Start: 2022-08-24 — End: 2023-08-24

## 2022-08-26 ENCOUNTER — Ambulatory Visit: Admit: 2022-08-26 | Discharge: 2022-08-27 | Payer: MEDICAID

## 2022-08-31 DIAGNOSIS — N183 Type 2 diabetes mellitus with stage 3 chronic kidney disease, with long-term current use of insulin, unspecified whether stage 3a or 3b CKD (CMS-HCC): Principal | ICD-10-CM

## 2022-08-31 DIAGNOSIS — Z794 Long term (current) use of insulin: Principal | ICD-10-CM

## 2022-08-31 DIAGNOSIS — E1122 Type 2 diabetes mellitus with diabetic chronic kidney disease: Principal | ICD-10-CM

## 2022-08-31 MED ORDER — EMPAGLIFLOZIN 10 MG TABLET
ORAL_TABLET | Freq: Every day | ORAL | 3 refills | 90 days | Status: CP
Start: 2022-08-31 — End: ?

## 2022-09-06 MED ORDER — DAPAGLIFLOZIN PROPANEDIOL 10 MG TABLET
ORAL_TABLET | Freq: Every morning | ORAL | 3 refills | 90 days | Status: CP
Start: 2022-09-06 — End: ?

## 2022-09-09 DIAGNOSIS — G894 Chronic pain syndrome: Principal | ICD-10-CM

## 2022-09-09 DIAGNOSIS — G629 Polyneuropathy, unspecified: Principal | ICD-10-CM

## 2022-09-09 MED ORDER — METHADONE 10 MG TABLET
ORAL_TABLET | Freq: Four times a day (QID) | ORAL | 0 refills | 30 days
Start: 2022-09-09 — End: 2022-10-09

## 2022-09-10 MED ORDER — METHADONE 10 MG TABLET
ORAL_TABLET | Freq: Four times a day (QID) | ORAL | 0 refills | 30 days | Status: CP
Start: 2022-09-10 — End: 2022-10-10

## 2022-10-13 DIAGNOSIS — G894 Chronic pain syndrome: Principal | ICD-10-CM

## 2022-10-13 DIAGNOSIS — G629 Polyneuropathy, unspecified: Principal | ICD-10-CM

## 2022-10-13 MED ORDER — METHADONE 10 MG TABLET
ORAL | 0 refills | 30 days | Status: CP
Start: 2022-10-13 — End: 2022-11-12

## 2022-10-28 DIAGNOSIS — G629 Polyneuropathy, unspecified: Principal | ICD-10-CM

## 2022-10-28 DIAGNOSIS — G894 Chronic pain syndrome: Principal | ICD-10-CM

## 2022-10-28 MED ORDER — METHADONE 10 MG TABLET
ORAL | 0 refills | 30 days | Status: CP
Start: 2022-10-28 — End: 2022-11-27

## 2022-11-03 ENCOUNTER — Ambulatory Visit: Admit: 2022-11-03 | Discharge: 2022-11-04 | Payer: MEDICARE

## 2022-11-03 ENCOUNTER — Institutional Professional Consult (permissible substitution): Admit: 2022-11-03 | Discharge: 2022-11-04 | Payer: MEDICARE

## 2022-11-03 DIAGNOSIS — I10 Essential (primary) hypertension: Principal | ICD-10-CM

## 2022-11-03 DIAGNOSIS — E1165 Type 2 diabetes mellitus with hyperglycemia: Principal | ICD-10-CM

## 2022-11-03 DIAGNOSIS — Z1211 Encounter for screening for malignant neoplasm of colon: Principal | ICD-10-CM

## 2022-11-03 DIAGNOSIS — Z006 Encounter for examination for normal comparison and control in clinical research program: Principal | ICD-10-CM

## 2022-11-03 DIAGNOSIS — E1122 Type 2 diabetes mellitus with diabetic chronic kidney disease: Principal | ICD-10-CM

## 2022-11-03 DIAGNOSIS — F17200 Nicotine dependence, unspecified, uncomplicated: Principal | ICD-10-CM

## 2022-11-03 DIAGNOSIS — Z794 Long term (current) use of insulin: Principal | ICD-10-CM

## 2022-11-03 DIAGNOSIS — N183 Stage 3 chronic kidney disease, unspecified whether stage 3a or 3b CKD (CMS-HCC): Principal | ICD-10-CM

## 2022-11-03 DIAGNOSIS — G629 Polyneuropathy, unspecified: Principal | ICD-10-CM

## 2022-11-03 MED ORDER — DEXCOM G7 SENSOR DEVICE
5 refills | 0 days | Status: CP
Start: 2022-11-03 — End: ?

## 2022-11-03 MED ORDER — DEXCOM G7 RECEIVER
0 refills | 0 days | Status: CP
Start: 2022-11-03 — End: ?

## 2022-11-03 MED ORDER — METFORMIN 500 MG TABLET
ORAL_TABLET | Freq: Two times a day (BID) | ORAL | 3 refills | 90 days | Status: CP
Start: 2022-11-03 — End: ?

## 2022-11-08 DIAGNOSIS — Z794 Long term (current) use of insulin: Principal | ICD-10-CM

## 2022-11-08 DIAGNOSIS — E1122 Type 2 diabetes mellitus with diabetic chronic kidney disease: Principal | ICD-10-CM

## 2022-11-08 DIAGNOSIS — N183 Type 2 diabetes mellitus with stage 3 chronic kidney disease, with long-term current use of insulin, unspecified whether stage 3a or 3b CKD (CMS-HCC): Principal | ICD-10-CM

## 2022-11-08 MED ORDER — FREESTYLE LIBRE 3 SENSOR DEVICE
3 refills | 0 days | Status: CP
Start: 2022-11-08 — End: ?

## 2022-11-08 MED ORDER — FREESTYLE LIBRE 3 READER
0 refills | 0 days | Status: CP
Start: 2022-11-08 — End: ?

## 2022-11-11 DIAGNOSIS — G894 Chronic pain syndrome: Principal | ICD-10-CM

## 2022-11-11 DIAGNOSIS — G629 Polyneuropathy, unspecified: Principal | ICD-10-CM

## 2022-11-11 MED ORDER — METHADONE 10 MG TABLET
ORAL | 0 refills | 30 days | Status: CP
Start: 2022-11-11 — End: 2022-12-11

## 2022-11-15 DIAGNOSIS — G629 Polyneuropathy, unspecified: Principal | ICD-10-CM

## 2022-11-15 DIAGNOSIS — G894 Chronic pain syndrome: Principal | ICD-10-CM

## 2022-11-15 MED ORDER — METHADONE 10 MG TABLET
ORAL | 0 refills | 30 days | Status: CP
Start: 2022-11-15 — End: 2022-12-15

## 2022-11-29 ENCOUNTER — Institutional Professional Consult (permissible substitution): Admit: 2022-11-29 | Discharge: 2022-11-30

## 2022-11-29 DIAGNOSIS — Z006 Encounter for examination for normal comparison and control in clinical research program: Principal | ICD-10-CM

## 2022-12-01 DIAGNOSIS — R600 Localized edema: Principal | ICD-10-CM

## 2022-12-01 MED ORDER — FUROSEMIDE 20 MG TABLET
ORAL_TABLET | Freq: Every day | ORAL | 3 refills | 90 days | Status: CP
Start: 2022-12-01 — End: 2023-12-01

## 2022-12-08 ENCOUNTER — Ambulatory Visit: Admit: 2022-12-08 | Discharge: 2022-12-08

## 2022-12-16 DIAGNOSIS — G629 Polyneuropathy, unspecified: Principal | ICD-10-CM

## 2022-12-16 DIAGNOSIS — G894 Chronic pain syndrome: Principal | ICD-10-CM

## 2022-12-16 MED ORDER — METHADONE 10 MG TABLET
ORAL_TABLET | Freq: Four times a day (QID) | ORAL | 0 refills | 30 days
Start: 2022-12-16 — End: 2023-01-15

## 2022-12-17 MED ORDER — METHADONE 10 MG TABLET
ORAL_TABLET | ORAL | 0 refills | 30 days | Status: CP
Start: 2022-12-17 — End: 2023-01-16

## 2023-01-05 ENCOUNTER — Ambulatory Visit: Admit: 2023-01-05 | Discharge: 2023-01-06 | Payer: MEDICARE

## 2023-01-11 DIAGNOSIS — G629 Polyneuropathy, unspecified: Principal | ICD-10-CM

## 2023-01-11 DIAGNOSIS — G894 Chronic pain syndrome: Principal | ICD-10-CM

## 2023-01-11 MED ORDER — METHADONE 10 MG TABLET
ORAL_TABLET | Freq: Four times a day (QID) | ORAL | 0 refills | 30 days
Start: 2023-01-11 — End: 2023-02-10

## 2023-01-12 MED ORDER — METHADONE 10 MG TABLET
ORAL_TABLET | Freq: Four times a day (QID) | ORAL | 0 refills | 30 days | Status: CP
Start: 2023-01-12 — End: 2023-02-11

## 2023-02-14 DIAGNOSIS — G894 Chronic pain syndrome: Principal | ICD-10-CM

## 2023-02-14 DIAGNOSIS — G629 Polyneuropathy, unspecified: Principal | ICD-10-CM

## 2023-02-14 MED ORDER — METHADONE 10 MG TABLET
ORAL_TABLET | Freq: Four times a day (QID) | ORAL | 0 refills | 30 days | Status: CP
Start: 2023-02-14 — End: 2023-03-16

## 2023-03-15 DIAGNOSIS — G894 Chronic pain syndrome: Principal | ICD-10-CM

## 2023-03-15 DIAGNOSIS — G629 Polyneuropathy, unspecified: Principal | ICD-10-CM

## 2023-03-15 MED ORDER — METHADONE 10 MG TABLET
ORAL_TABLET | Freq: Four times a day (QID) | ORAL | 0 refills | 30.00 days
Start: 2023-03-15 — End: 2023-04-14

## 2023-03-20 MED ORDER — METHADONE 10 MG TABLET
ORAL_TABLET | Freq: Four times a day (QID) | ORAL | 0 refills | 30 days | Status: CP
Start: 2023-03-20 — End: 2023-04-19

## 2023-03-22 ENCOUNTER — Ambulatory Visit
Admit: 2023-03-22 | Discharge: 2023-03-23 | Payer: MEDICARE | Attending: Infectious Disease | Primary: Infectious Disease

## 2023-03-22 DIAGNOSIS — Z794 Long term (current) use of insulin: Principal | ICD-10-CM

## 2023-03-22 DIAGNOSIS — B20 Human immunodeficiency virus [HIV] disease: Principal | ICD-10-CM

## 2023-03-22 DIAGNOSIS — E1165 Type 2 diabetes mellitus with hyperglycemia: Principal | ICD-10-CM

## 2023-03-22 DIAGNOSIS — Z1231 Encounter for screening mammogram for malignant neoplasm of breast: Principal | ICD-10-CM

## 2023-03-22 DIAGNOSIS — Z23 Encounter for immunization: Principal | ICD-10-CM

## 2023-03-22 DIAGNOSIS — Z5181 Encounter for therapeutic drug level monitoring: Principal | ICD-10-CM

## 2023-03-22 DIAGNOSIS — Z9189 Other specified personal risk factors, not elsewhere classified: Principal | ICD-10-CM

## 2023-03-22 DIAGNOSIS — E1122 Type 2 diabetes mellitus with diabetic chronic kidney disease: Principal | ICD-10-CM

## 2023-03-22 DIAGNOSIS — N1832 Type 2 diabetes mellitus with stage 3b chronic kidney disease, with long-term current use of insulin (CMS-HCC): Principal | ICD-10-CM

## 2023-03-22 DIAGNOSIS — Z79899 Other long term (current) drug therapy: Principal | ICD-10-CM

## 2023-04-07 DIAGNOSIS — G629 Polyneuropathy, unspecified: Principal | ICD-10-CM

## 2023-04-07 DIAGNOSIS — G894 Chronic pain syndrome: Principal | ICD-10-CM

## 2023-04-07 MED ORDER — METHADONE 10 MG TABLET
ORAL_TABLET | Freq: Four times a day (QID) | ORAL | 0 refills | 30 days | Status: CP
Start: 2023-04-07 — End: 2023-05-07

## 2023-04-18 ENCOUNTER — Ambulatory Visit: Admit: 2023-04-18 | Discharge: 2023-04-19 | Payer: MEDICARE

## 2023-04-18 DIAGNOSIS — I10 Essential (primary) hypertension: Principal | ICD-10-CM

## 2023-04-18 DIAGNOSIS — N183 Type 2 diabetes mellitus with stage 3 chronic kidney disease, with long-term current use of insulin, unspecified whether stage 3a or 3b CKD (CMS-HCC): Principal | ICD-10-CM

## 2023-04-18 DIAGNOSIS — Z794 Long term (current) use of insulin: Principal | ICD-10-CM

## 2023-04-18 DIAGNOSIS — G629 Polyneuropathy, unspecified: Principal | ICD-10-CM

## 2023-04-18 DIAGNOSIS — E1122 Type 2 diabetes mellitus with diabetic chronic kidney disease: Principal | ICD-10-CM

## 2023-04-18 DIAGNOSIS — Z21 Asymptomatic human immunodeficiency virus [HIV] infection status: Principal | ICD-10-CM

## 2023-04-18 DIAGNOSIS — I311 Chronic constrictive pericarditis: Principal | ICD-10-CM

## 2023-04-18 MED ORDER — DEXCOM G7 SENSOR DEVICE
PRN refills | 0.00 days
Start: 2023-04-18 — End: ?

## 2023-04-18 MED ORDER — OZEMPIC 0.25 MG OR 0.5 MG (2 MG/3 ML) SUBCUTANEOUS PEN INJECTOR
SUBCUTANEOUS | 2 refills | 56.00 days | Status: CP
Start: 2023-04-18 — End: ?

## 2023-04-18 MED ORDER — DEXCOM G7 RECEIVER
0 refills | 0.00 days | Status: CP
Start: 2023-04-18 — End: ?

## 2023-04-19 DIAGNOSIS — Z21 Asymptomatic human immunodeficiency virus [HIV] infection status: Principal | ICD-10-CM

## 2023-04-19 MED ORDER — DOVATO 50 MG-300 MG TABLET
ORAL_TABLET | Freq: Every evening | ORAL | 6 refills | 0.00 days
Start: 2023-04-19 — End: ?

## 2023-04-20 MED ORDER — DOVATO 50 MG-300 MG TABLET
ORAL_TABLET | Freq: Every evening | ORAL | 5 refills | 0.00 days | Status: CP
Start: 2023-04-20 — End: ?

## 2023-04-21 MED ORDER — DEXCOM G7 SENSOR DEVICE
PRN refills | 0.00 days
Start: 2023-04-21 — End: ?

## 2023-05-04 DIAGNOSIS — Z21 Asymptomatic human immunodeficiency virus [HIV] infection status: Principal | ICD-10-CM

## 2023-05-04 MED ORDER — ACYCLOVIR 400 MG TABLET
ORAL_TABLET | Freq: Three times a day (TID) | ORAL | 4 refills | 0.00 days | PRN
Start: 2023-05-04 — End: ?

## 2023-05-06 MED ORDER — ACYCLOVIR 400 MG TABLET
ORAL_TABLET | Freq: Three times a day (TID) | ORAL | 4 refills | 7.00 days | Status: CP | PRN
Start: 2023-05-06 — End: ?

## 2023-05-08 DIAGNOSIS — Z5181 Encounter for therapeutic drug level monitoring: Principal | ICD-10-CM

## 2023-05-08 DIAGNOSIS — Z1231 Encounter for screening mammogram for malignant neoplasm of breast: Principal | ICD-10-CM

## 2023-05-08 DIAGNOSIS — E1165 Type 2 diabetes mellitus with hyperglycemia: Principal | ICD-10-CM

## 2023-05-08 DIAGNOSIS — Z794 Long term (current) use of insulin: Principal | ICD-10-CM

## 2023-05-08 DIAGNOSIS — Z79899 Other long term (current) drug therapy: Principal | ICD-10-CM

## 2023-05-08 DIAGNOSIS — E1122 Type 2 diabetes mellitus with diabetic chronic kidney disease: Principal | ICD-10-CM

## 2023-05-08 DIAGNOSIS — B20 Human immunodeficiency virus [HIV] disease: Principal | ICD-10-CM

## 2023-05-08 DIAGNOSIS — Z9189 Other specified personal risk factors, not elsewhere classified: Principal | ICD-10-CM

## 2023-05-08 DIAGNOSIS — N1832 Type 2 diabetes mellitus with stage 3b chronic kidney disease, with long-term current use of insulin (CMS-HCC): Principal | ICD-10-CM

## 2023-05-08 DIAGNOSIS — Z23 Encounter for immunization: Principal | ICD-10-CM

## 2023-05-10 DIAGNOSIS — G629 Polyneuropathy, unspecified: Principal | ICD-10-CM

## 2023-05-10 DIAGNOSIS — G894 Chronic pain syndrome: Principal | ICD-10-CM

## 2023-05-10 MED ORDER — METHADONE 10 MG TABLET
ORAL_TABLET | Freq: Four times a day (QID) | ORAL | 0 refills | 30 days | Status: CP
Start: 2023-05-10 — End: 2023-06-09

## 2023-06-02 DIAGNOSIS — G629 Polyneuropathy, unspecified: Principal | ICD-10-CM

## 2023-06-02 MED ORDER — AMITRIPTYLINE 100 MG TABLET
ORAL_TABLET | Freq: Every evening | ORAL | 2 refills | 90 days | Status: CP | PRN
Start: 2023-06-02 — End: ?

## 2023-06-12 DIAGNOSIS — G894 Chronic pain syndrome: Principal | ICD-10-CM

## 2023-06-12 DIAGNOSIS — G629 Polyneuropathy, unspecified: Principal | ICD-10-CM

## 2023-06-12 MED ORDER — METHADONE 10 MG TABLET
ORAL_TABLET | Freq: Four times a day (QID) | ORAL | 0 refills | 30.00 days | Status: CP
Start: 2023-06-12 — End: 2023-07-12

## 2023-06-21 ENCOUNTER — Ambulatory Visit: Admit: 2023-06-21 | Discharge: 2023-06-22 | Payer: Medicare (Managed Care)

## 2023-06-21 DIAGNOSIS — Z794 Long term (current) use of insulin: Principal | ICD-10-CM

## 2023-06-21 DIAGNOSIS — J9801 Acute bronchospasm: Principal | ICD-10-CM

## 2023-06-21 DIAGNOSIS — J449 Chronic obstructive pulmonary disease, unspecified: Principal | ICD-10-CM

## 2023-06-21 DIAGNOSIS — E782 Mixed hyperlipidemia: Principal | ICD-10-CM

## 2023-06-21 DIAGNOSIS — R600 Localized edema: Principal | ICD-10-CM

## 2023-06-21 DIAGNOSIS — N183 Stage 3 chronic kidney disease, unspecified whether stage 3a or 3b CKD (CMS-HCC): Principal | ICD-10-CM

## 2023-06-21 DIAGNOSIS — E119 Type 2 diabetes mellitus without complications: Principal | ICD-10-CM

## 2023-06-21 DIAGNOSIS — Z1211 Encounter for screening for malignant neoplasm of colon: Principal | ICD-10-CM

## 2023-06-21 MED ORDER — MOMETASONE-FORMOTEROL HFA 100 MCG-5 MCG/ACTUATION AEROSOL INHALER
Freq: Two times a day (BID) | RESPIRATORY_TRACT | 6 refills | 33.00 days | Status: CP
Start: 2023-06-21 — End: ?

## 2023-06-21 MED ORDER — SEMAGLUTIDE 1 MG/DOSE (4 MG/3 ML) SUBCUTANEOUS PEN INJECTOR
SUBCUTANEOUS | 5 refills | 28.00 days | Status: CP
Start: 2023-06-21 — End: ?

## 2023-06-21 MED ORDER — ATORVASTATIN 20 MG TABLET
ORAL_TABLET | Freq: Every day | ORAL | 3 refills | 100.00 days | Status: CP
Start: 2023-06-21 — End: 2024-07-25

## 2023-06-21 MED ORDER — ASPIRIN 81 MG TABLET,DELAYED RELEASE
ORAL_TABLET | Freq: Every day | ORAL | 3 refills | 90.00 days | Status: CP
Start: 2023-06-21 — End: ?

## 2023-06-21 MED ORDER — ALBUTEROL SULFATE HFA 90 MCG/ACTUATION AEROSOL INHALER
Freq: Four times a day (QID) | RESPIRATORY_TRACT | PRN refills | 25.00 days | Status: CP | PRN
Start: 2023-06-21 — End: 2024-06-20

## 2023-07-11 DIAGNOSIS — G629 Polyneuropathy, unspecified: Principal | ICD-10-CM

## 2023-07-11 DIAGNOSIS — G894 Chronic pain syndrome: Principal | ICD-10-CM

## 2023-07-11 MED ORDER — METHADONE 10 MG TABLET
ORAL_TABLET | Freq: Four times a day (QID) | ORAL | 0 refills | 30.00000 days
Start: 2023-07-11 — End: 2023-08-10

## 2023-07-12 MED ORDER — METHADONE 10 MG TABLET
ORAL_TABLET | Freq: Four times a day (QID) | ORAL | 0 refills | 30.00000 days | Status: CP
Start: 2023-07-12 — End: 2023-08-11

## 2023-08-14 DIAGNOSIS — G629 Polyneuropathy, unspecified: Principal | ICD-10-CM

## 2023-08-14 DIAGNOSIS — G894 Chronic pain syndrome: Principal | ICD-10-CM

## 2023-08-14 MED ORDER — METHADONE 10 MG TABLET
ORAL_TABLET | Freq: Four times a day (QID) | ORAL | 0 refills | 30.00000 days
Start: 2023-08-14 — End: 2023-08-14

## 2023-08-29 DIAGNOSIS — E1165 Type 2 diabetes mellitus with hyperglycemia: Principal | ICD-10-CM

## 2023-08-29 DIAGNOSIS — R600 Localized edema: Principal | ICD-10-CM

## 2023-08-29 MED ORDER — FUROSEMIDE 20 MG TABLET
ORAL_TABLET | Freq: Every day | ORAL | 7 refills | 90.00000 days | Status: CP
Start: 2023-08-29 — End: ?

## 2023-08-29 MED ORDER — LANTUS SOLOSTAR U-100 INSULIN 100 UNIT/ML (3 ML) SUBCUTANEOUS PEN
SUBCUTANEOUS | 7 refills | 0.00000 days | Status: CP
Start: 2023-08-29 — End: ?

## 2023-09-12 DIAGNOSIS — G894 Chronic pain syndrome: Principal | ICD-10-CM

## 2023-09-12 DIAGNOSIS — G629 Polyneuropathy, unspecified: Principal | ICD-10-CM

## 2023-09-12 MED ORDER — METHADONE 10 MG TABLET
ORAL_TABLET | Freq: Four times a day (QID) | ORAL | 0 refills | 30.00000 days | Status: CP
Start: 2023-09-12 — End: 2023-10-12

## 2023-09-20 ENCOUNTER — Encounter
Admit: 2023-09-20 | Discharge: 2023-09-20 | Payer: Medicare (Managed Care) | Attending: Infectious Disease | Primary: Infectious Disease

## 2023-09-20 DIAGNOSIS — B20 Human immunodeficiency virus [HIV] disease: Principal | ICD-10-CM

## 2023-09-20 DIAGNOSIS — Z79899 Other long term (current) drug therapy: Principal | ICD-10-CM

## 2023-09-20 DIAGNOSIS — K219 Gastro-esophageal reflux disease without esophagitis: Principal | ICD-10-CM

## 2023-09-20 DIAGNOSIS — G894 Chronic pain syndrome: Principal | ICD-10-CM

## 2023-09-20 DIAGNOSIS — Z794 Long term (current) use of insulin: Principal | ICD-10-CM

## 2023-09-20 DIAGNOSIS — G629 Polyneuropathy, unspecified: Principal | ICD-10-CM

## 2023-09-20 DIAGNOSIS — Z5181 Encounter for therapeutic drug level monitoring: Principal | ICD-10-CM

## 2023-09-20 DIAGNOSIS — E782 Mixed hyperlipidemia: Principal | ICD-10-CM

## 2023-09-20 DIAGNOSIS — R600 Localized edema: Principal | ICD-10-CM

## 2023-09-20 DIAGNOSIS — Z21 Asymptomatic human immunodeficiency virus [HIV] infection status: Principal | ICD-10-CM

## 2023-09-20 DIAGNOSIS — J449 Chronic obstructive pulmonary disease, unspecified: Principal | ICD-10-CM

## 2023-09-20 DIAGNOSIS — Z23 Encounter for immunization: Principal | ICD-10-CM

## 2023-09-20 DIAGNOSIS — Z1231 Encounter for screening mammogram for malignant neoplasm of breast: Principal | ICD-10-CM

## 2023-09-20 DIAGNOSIS — E119 Type 2 diabetes mellitus without complications: Principal | ICD-10-CM

## 2023-09-20 DIAGNOSIS — Z9189 Other specified personal risk factors, not elsewhere classified: Principal | ICD-10-CM

## 2023-09-20 DIAGNOSIS — J9801 Acute bronchospasm: Principal | ICD-10-CM

## 2023-09-20 MED ORDER — ACYCLOVIR 400 MG TABLET
ORAL_TABLET | Freq: Three times a day (TID) | ORAL | 4 refills | 7.00000 days | Status: CP | PRN
Start: 2023-09-20 — End: ?

## 2023-09-20 MED ORDER — DAPAGLIFLOZIN PROPANEDIOL 10 MG TABLET
ORAL_TABLET | Freq: Every day | ORAL | 3 refills | 30.00000 days | Status: CP
Start: 2023-09-20 — End: ?

## 2023-09-20 MED ORDER — MOMETASONE-FORMOTEROL HFA 100 MCG-5 MCG/ACTUATION AEROSOL INHALER
Freq: Two times a day (BID) | RESPIRATORY_TRACT | 6 refills | 33.00000 days | Status: CP
Start: 2023-09-20 — End: ?

## 2023-09-20 MED ORDER — ALBUTEROL SULFATE HFA 90 MCG/ACTUATION AEROSOL INHALER
Freq: Four times a day (QID) | RESPIRATORY_TRACT | PRN refills | 25.00000 days | Status: CP | PRN
Start: 2023-09-20 — End: 2024-09-19

## 2023-09-20 MED ORDER — DOVATO 50 MG-300 MG TABLET
ORAL_TABLET | Freq: Every evening | ORAL | 5 refills | 0.00000 days | Status: CP
Start: 2023-09-20 — End: ?

## 2023-09-20 MED ORDER — ATORVASTATIN 20 MG TABLET
ORAL_TABLET | Freq: Every day | ORAL | 3 refills | 100.00000 days | Status: CP
Start: 2023-09-20 — End: 2024-10-24

## 2023-09-20 MED ORDER — OMEPRAZOLE 40 MG CAPSULE,DELAYED RELEASE
ORAL_CAPSULE | Freq: Every day | ORAL | 8 refills | 30.00000 days | Status: CP | PRN
Start: 2023-09-20 — End: ?

## 2023-09-20 MED ORDER — AMITRIPTYLINE 100 MG TABLET
ORAL_TABLET | Freq: Every evening | ORAL | 2 refills | 30.00000 days | Status: CP | PRN
Start: 2023-09-20 — End: ?

## 2023-09-20 MED ORDER — FUROSEMIDE 20 MG TABLET
ORAL_TABLET | Freq: Every day | ORAL | 7 refills | 90.00000 days | Status: CP
Start: 2023-09-20 — End: ?

## 2023-10-03 ENCOUNTER — Encounter: Admit: 2023-10-03 | Discharge: 2023-10-03 | Payer: Medicare (Managed Care)

## 2023-10-03 DIAGNOSIS — Z794 Long term (current) use of insulin: Principal | ICD-10-CM

## 2023-10-03 DIAGNOSIS — N183 Stage 3 chronic kidney disease, unspecified whether stage 3a or 3b CKD (CMS-HCC): Principal | ICD-10-CM

## 2023-10-03 DIAGNOSIS — Z0000 Encounter for general adult medical examination without abnormal findings: Principal | ICD-10-CM

## 2023-10-03 DIAGNOSIS — E1122 Type 2 diabetes mellitus with diabetic chronic kidney disease: Principal | ICD-10-CM

## 2023-10-03 DIAGNOSIS — Z780 Asymptomatic menopausal state: Principal | ICD-10-CM

## 2023-10-03 DIAGNOSIS — Z21 Asymptomatic human immunodeficiency virus [HIV] infection status: Principal | ICD-10-CM

## 2023-10-03 MED ORDER — OZEMPIC 2 MG/DOSE (8 MG/3 ML) SUBCUTANEOUS PEN INJECTOR
SUBCUTANEOUS | 5 refills | 28.00000 days | Status: CP
Start: 2023-10-03 — End: ?

## 2023-10-03 MED ORDER — DEXCOM G7 RECEIVER
0 refills | 0.00000 days | Status: CP
Start: 2023-10-03 — End: ?

## 2023-10-03 MED ORDER — DEXCOM G7 SENSOR DEVICE
5 refills | 0.00000 days | Status: CP
Start: 2023-10-03 — End: ?

## 2023-10-13 DIAGNOSIS — G629 Polyneuropathy, unspecified: Principal | ICD-10-CM

## 2023-10-13 DIAGNOSIS — G894 Chronic pain syndrome: Principal | ICD-10-CM

## 2023-10-13 MED ORDER — METHADONE 10 MG TABLET
ORAL_TABLET | Freq: Four times a day (QID) | ORAL | 0 refills | 30.00000 days
Start: 2023-10-13 — End: 2023-11-12

## 2023-10-13 MED ORDER — PEG 3350-ELECTROLYTES 236 GRAM-22.74 GRAM-6.74 GRAM-5.86 GRAM SOLUTION
0 refills | 0.00000 days | Status: CP
Start: 2023-10-13 — End: ?

## 2023-10-16 DIAGNOSIS — G629 Polyneuropathy, unspecified: Principal | ICD-10-CM

## 2023-10-16 DIAGNOSIS — G894 Chronic pain syndrome: Principal | ICD-10-CM

## 2023-10-16 MED ORDER — METHADONE 10 MG TABLET
ORAL_TABLET | Freq: Four times a day (QID) | ORAL | 0 refills | 30.00000 days | Status: CP
Start: 2023-10-16 — End: 2023-11-15

## 2023-11-03 MED ORDER — EASY COMFORT PEN NEEDLES 32 GAUGE X 5/32" (4 MM)
11 refills | 0.00000 days
Start: 2023-11-03 — End: ?

## 2023-11-09 DIAGNOSIS — G629 Polyneuropathy, unspecified: Principal | ICD-10-CM

## 2023-11-09 DIAGNOSIS — G894 Chronic pain syndrome: Principal | ICD-10-CM

## 2023-11-09 MED ORDER — METHADONE 10 MG TABLET
ORAL_TABLET | Freq: Four times a day (QID) | ORAL | 0 refills | 30.00000 days
Start: 2023-11-09 — End: 2023-12-09

## 2023-11-10 DIAGNOSIS — G894 Chronic pain syndrome: Principal | ICD-10-CM

## 2023-11-10 DIAGNOSIS — G629 Polyneuropathy, unspecified: Principal | ICD-10-CM

## 2023-11-10 MED ORDER — METHADONE 10 MG TABLET
ORAL_TABLET | Freq: Four times a day (QID) | ORAL | 0 refills | 30.00000 days
Start: 2023-11-10 — End: 2023-12-10

## 2023-11-11 MED ORDER — METHADONE 10 MG TABLET
ORAL_TABLET | Freq: Four times a day (QID) | ORAL | 0 refills | 30.00000 days | Status: CP
Start: 2023-11-11 — End: 2023-12-11

## 2023-11-22 DIAGNOSIS — E1122 Type 2 diabetes mellitus with diabetic chronic kidney disease: Principal | ICD-10-CM

## 2023-11-22 DIAGNOSIS — N183 Type 2 diabetes mellitus with stage 3 chronic kidney disease, with long-term current use of insulin, unspecified whether stage 3a or 3b CKD    (CMS-HCC): Principal | ICD-10-CM

## 2023-11-22 DIAGNOSIS — E1165 Type 2 diabetes mellitus with hyperglycemia: Principal | ICD-10-CM

## 2023-11-22 DIAGNOSIS — Z794 Long term (current) use of insulin: Principal | ICD-10-CM

## 2023-11-22 MED ORDER — METFORMIN 500 MG TABLET
ORAL_TABLET | ORAL | 2 refills | 0.00000 days | Status: CP
Start: 2023-11-22 — End: ?

## 2023-12-12 ENCOUNTER — Inpatient Hospital Stay: Admit: 2023-12-12 | Discharge: 2023-12-12 | Payer: Medicare (Managed Care)

## 2023-12-12 DIAGNOSIS — R928 Other abnormal and inconclusive findings on diagnostic imaging of breast: Principal | ICD-10-CM

## 2023-12-14 DIAGNOSIS — G894 Chronic pain syndrome: Principal | ICD-10-CM

## 2023-12-14 DIAGNOSIS — G629 Polyneuropathy, unspecified: Principal | ICD-10-CM

## 2023-12-14 MED ORDER — METHADONE 10 MG TABLET
ORAL_TABLET | Freq: Four times a day (QID) | ORAL | 0 refills | 30.00000 days | Status: CP
Start: 2023-12-14 — End: 2024-01-13

## 2024-01-12 DIAGNOSIS — G894 Chronic pain syndrome: Principal | ICD-10-CM

## 2024-01-12 DIAGNOSIS — G629 Polyneuropathy, unspecified: Principal | ICD-10-CM

## 2024-01-12 MED ORDER — METHADONE 10 MG TABLET
ORAL_TABLET | Freq: Four times a day (QID) | ORAL | 0 refills | 30.00000 days | Status: CP
Start: 2024-01-12 — End: 2024-02-11

## 2024-01-17 ENCOUNTER — Inpatient Hospital Stay: Admit: 2024-01-17 | Discharge: 2024-01-17 | Payer: Medicare (Managed Care)

## 2024-02-08 DIAGNOSIS — G629 Polyneuropathy, unspecified: Principal | ICD-10-CM

## 2024-02-08 DIAGNOSIS — G894 Chronic pain syndrome: Principal | ICD-10-CM

## 2024-02-08 MED ORDER — METHADONE 10 MG TABLET
ORAL_TABLET | Freq: Four times a day (QID) | ORAL | 0 refills | 30.00000 days | Status: CP
Start: 2024-02-08 — End: 2024-03-09

## 2024-02-16 MED ORDER — DAPAGLIFLOZIN PROPANEDIOL 10 MG TABLET
ORAL_TABLET | Freq: Every day | ORAL | 3 refills | 0.00000 days
Start: 2024-02-16 — End: ?

## 2024-02-17 MED ORDER — DAPAGLIFLOZIN PROPANEDIOL 10 MG TABLET
ORAL_TABLET | Freq: Every day | ORAL | 3 refills | 30.00000 days | Status: CP
Start: 2024-02-17 — End: ?

## 2024-03-14 DIAGNOSIS — G629 Polyneuropathy, unspecified: Principal | ICD-10-CM

## 2024-03-14 DIAGNOSIS — G894 Chronic pain syndrome: Principal | ICD-10-CM

## 2024-03-14 MED ORDER — METHADONE 10 MG TABLET
ORAL_TABLET | Freq: Four times a day (QID) | ORAL | 0 refills | 30.00000 days
Start: 2024-03-14 — End: 2024-04-13

## 2024-03-15 DIAGNOSIS — G629 Polyneuropathy, unspecified: Principal | ICD-10-CM

## 2024-03-15 DIAGNOSIS — G894 Chronic pain syndrome: Principal | ICD-10-CM

## 2024-03-15 MED ORDER — METHADONE 10 MG TABLET
ORAL_TABLET | Freq: Four times a day (QID) | ORAL | 0 refills | 30.00000 days | Status: CP
Start: 2024-03-15 — End: 2024-04-14

## 2024-03-27 ENCOUNTER — Encounter: Admit: 2024-03-27 | Discharge: 2024-03-27 | Payer: Medicare (Managed Care)

## 2024-03-27 ENCOUNTER — Encounter
Admit: 2024-03-27 | Discharge: 2024-03-27 | Payer: Medicare (Managed Care) | Attending: Infectious Disease | Primary: Infectious Disease

## 2024-03-27 DIAGNOSIS — Z23 Encounter for immunization: Principal | ICD-10-CM

## 2024-03-27 DIAGNOSIS — B20 Human immunodeficiency virus [HIV] disease: Principal | ICD-10-CM

## 2024-03-27 DIAGNOSIS — E1165 Type 2 diabetes mellitus with hyperglycemia: Principal | ICD-10-CM

## 2024-03-27 DIAGNOSIS — Z79899 Other long term (current) drug therapy: Principal | ICD-10-CM

## 2024-03-27 DIAGNOSIS — Z1211 Encounter for screening for malignant neoplasm of colon: Principal | ICD-10-CM

## 2024-03-27 DIAGNOSIS — Z9189 Other specified personal risk factors, not elsewhere classified: Principal | ICD-10-CM

## 2024-03-27 DIAGNOSIS — Z5181 Encounter for therapeutic drug level monitoring: Principal | ICD-10-CM

## 2024-03-27 MED ORDER — LANTUS SOLOSTAR U-100 INSULIN 100 UNIT/ML (3 ML) SUBCUTANEOUS PEN
Freq: Every evening | SUBCUTANEOUS | 7 refills | 37.00000 days | Status: CP
Start: 2024-03-27 — End: ?

## 2024-04-08 DIAGNOSIS — G894 Chronic pain syndrome: Principal | ICD-10-CM

## 2024-04-08 DIAGNOSIS — G629 Polyneuropathy, unspecified: Secondary | ICD-10-CM

## 2024-04-08 MED ORDER — METHADONE 10 MG TABLET
ORAL_TABLET | Freq: Four times a day (QID) | ORAL | 0 refills | 30.00000 days
Start: 2024-04-08 — End: 2024-05-08

## 2024-04-09 DIAGNOSIS — N183 Type 2 diabetes mellitus with stage 3 chronic kidney disease, with long-term current use of insulin, unspecified whether stage 3a or 3b CKD (CMS-HCC): Secondary | ICD-10-CM

## 2024-04-09 DIAGNOSIS — E1122 Type 2 diabetes mellitus with diabetic chronic kidney disease: Principal | ICD-10-CM

## 2024-04-09 DIAGNOSIS — Z794 Long term (current) use of insulin: Secondary | ICD-10-CM

## 2024-04-09 MED ORDER — OZEMPIC 2 MG/DOSE (8 MG/3 ML) SUBCUTANEOUS PEN INJECTOR
SUBCUTANEOUS | 5 refills | 0.00000 days | Status: CP
Start: 2024-04-09 — End: ?

## 2024-04-10 MED ORDER — METHADONE 10 MG TABLET
ORAL_TABLET | Freq: Four times a day (QID) | ORAL | 0 refills | 30.00000 days | Status: CP
Start: 2024-04-10 — End: 2024-05-10
